# Patient Record
Sex: Female | Born: 1975 | Race: White | Hispanic: No | Marital: Married | Smoking: Never smoker
Health system: Southern US, Community
[De-identification: ages and names within clinical notes are randomized; demographics above are authoritative.]

## PROBLEM LIST (undated history)

## (undated) DIAGNOSIS — K219 Gastro-esophageal reflux disease without esophagitis: Secondary | ICD-10-CM

## (undated) DIAGNOSIS — D649 Anemia, unspecified: Secondary | ICD-10-CM

## (undated) DIAGNOSIS — M419 Scoliosis, unspecified: Secondary | ICD-10-CM

## (undated) DIAGNOSIS — I341 Nonrheumatic mitral (valve) prolapse: Secondary | ICD-10-CM

## (undated) DIAGNOSIS — R51 Headache: Secondary | ICD-10-CM

## (undated) DIAGNOSIS — G35 Multiple sclerosis: Secondary | ICD-10-CM

## (undated) DIAGNOSIS — H209 Unspecified iridocyclitis: Secondary | ICD-10-CM

## (undated) DIAGNOSIS — F32A Depression, unspecified: Secondary | ICD-10-CM

## (undated) DIAGNOSIS — F329 Major depressive disorder, single episode, unspecified: Secondary | ICD-10-CM

## (undated) DIAGNOSIS — D62 Acute posthemorrhagic anemia: Secondary | ICD-10-CM

## (undated) HISTORY — PX: WISDOM TOOTH EXTRACTION: SHX21

## (undated) HISTORY — PX: TONSILLECTOMY: SUR1361

---

## 2004-08-17 ENCOUNTER — Ambulatory Visit: Payer: Self-pay | Admitting: Psychiatry

## 2005-05-12 ENCOUNTER — Ambulatory Visit: Payer: Self-pay

## 2005-10-06 ENCOUNTER — Ambulatory Visit: Payer: Self-pay

## 2007-12-13 ENCOUNTER — Ambulatory Visit (HOSPITAL_COMMUNITY): Admission: RE | Admit: 2007-12-13 | Discharge: 2007-12-13 | Payer: Self-pay | Admitting: *Deleted

## 2008-01-04 ENCOUNTER — Ambulatory Visit (HOSPITAL_COMMUNITY): Admission: RE | Admit: 2008-01-04 | Discharge: 2008-01-04 | Payer: Self-pay | Admitting: *Deleted

## 2008-01-10 ENCOUNTER — Ambulatory Visit: Payer: Self-pay | Admitting: Internal Medicine

## 2008-01-22 ENCOUNTER — Ambulatory Visit: Payer: Self-pay

## 2008-01-22 ENCOUNTER — Encounter: Payer: Self-pay | Admitting: Internal Medicine

## 2008-02-01 ENCOUNTER — Ambulatory Visit (HOSPITAL_COMMUNITY): Admission: RE | Admit: 2008-02-01 | Discharge: 2008-02-01 | Payer: Self-pay | Admitting: *Deleted

## 2008-04-03 ENCOUNTER — Encounter: Admission: RE | Admit: 2008-04-03 | Discharge: 2008-04-03 | Payer: Self-pay | Admitting: Obstetrics and Gynecology

## 2008-05-09 ENCOUNTER — Encounter: Admission: RE | Admit: 2008-05-09 | Discharge: 2008-05-09 | Payer: Self-pay | Admitting: Obstetrics and Gynecology

## 2008-05-30 ENCOUNTER — Inpatient Hospital Stay (HOSPITAL_COMMUNITY): Admission: AD | Admit: 2008-05-30 | Discharge: 2008-06-03 | Payer: Self-pay | Admitting: Obstetrics and Gynecology

## 2008-05-31 ENCOUNTER — Encounter (INDEPENDENT_AMBULATORY_CARE_PROVIDER_SITE_OTHER): Payer: Self-pay | Admitting: Obstetrics and Gynecology

## 2008-06-04 ENCOUNTER — Encounter: Admission: RE | Admit: 2008-06-04 | Discharge: 2008-07-03 | Payer: Self-pay | Admitting: Obstetrics and Gynecology

## 2008-07-04 ENCOUNTER — Encounter: Admission: RE | Admit: 2008-07-04 | Discharge: 2008-08-03 | Payer: Self-pay | Admitting: Obstetrics and Gynecology

## 2009-07-17 IMAGING — US US OB DETAIL+14 WK
1 series · 14 of 28 positions shown · non-contrast
Comparison: none

OBSTETRICAL ULTRASOUND:
 This ultrasound was performed in The [HOSPITAL], and the [facility] report will be stored to [REDACTED] PACS.

[Series 1: us ob detail+14 wk · 14 of 76 slices shown]
[im 3/76]
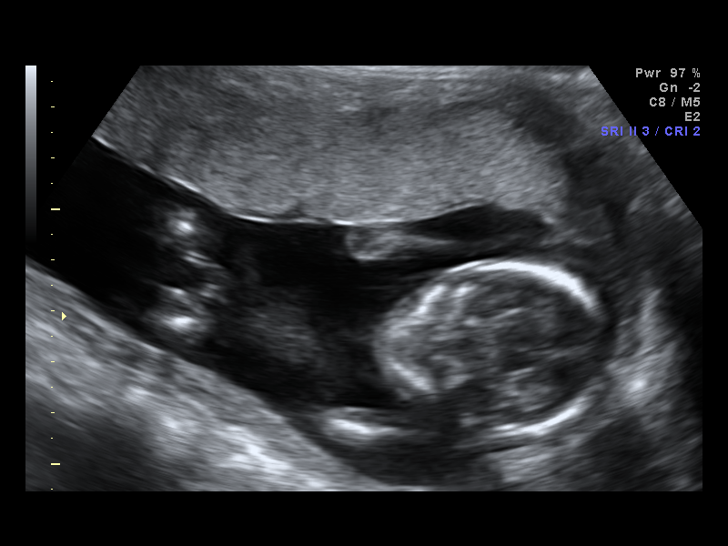
[im 9/76]
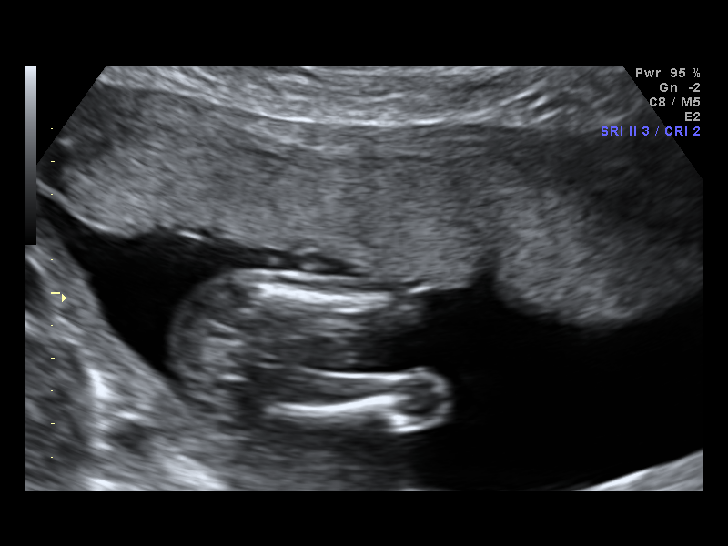
[im 14/76]
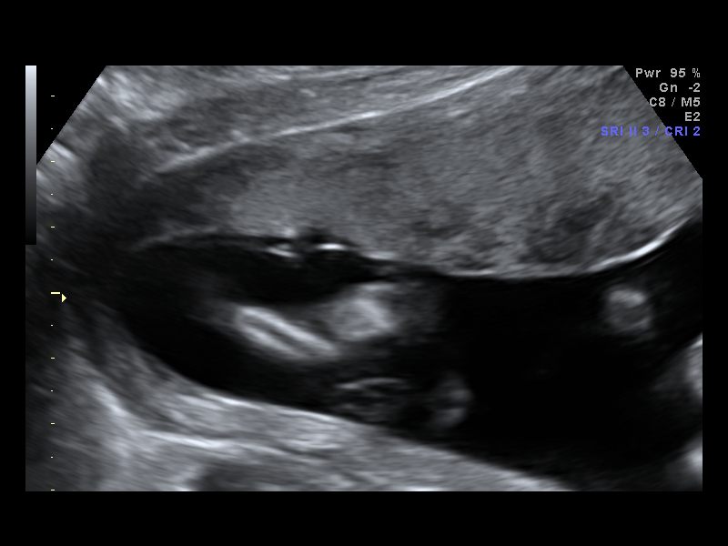
[im 20/76]
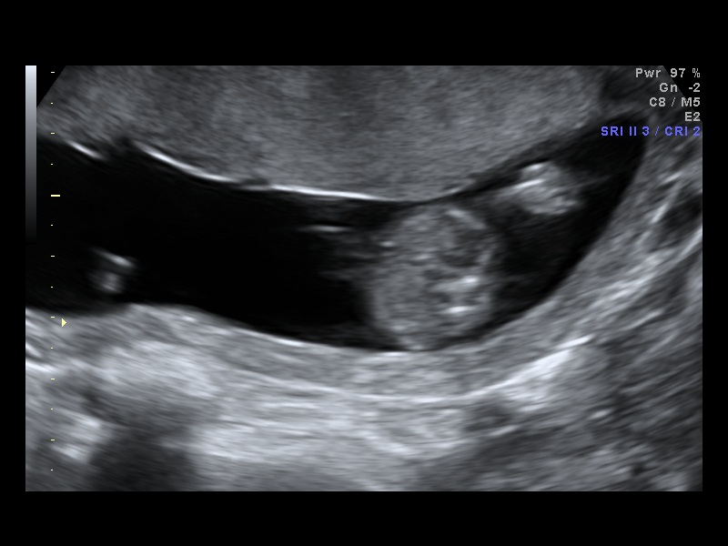
[im 26/76]
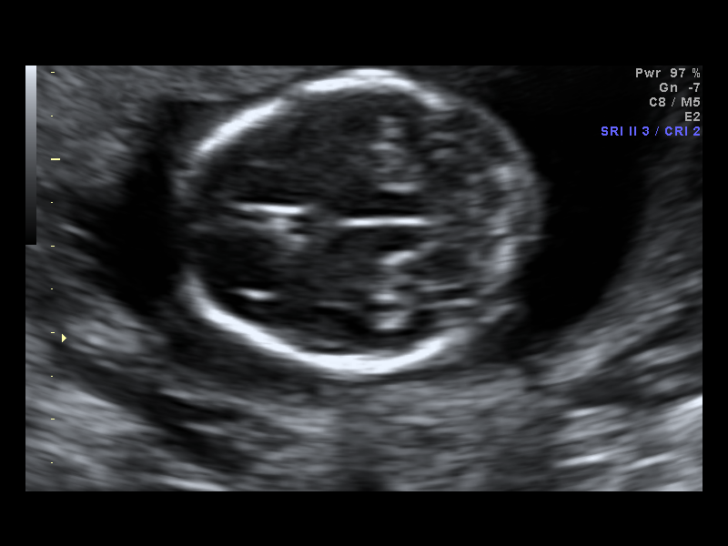
[im 31/76]
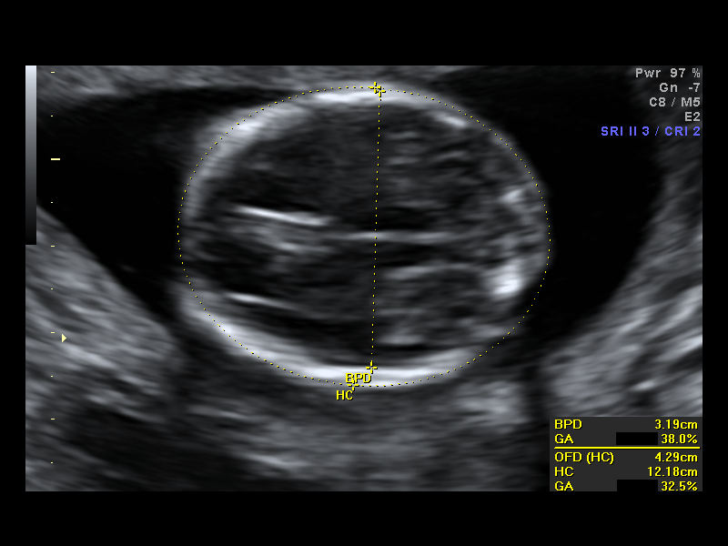
[im 37/76]
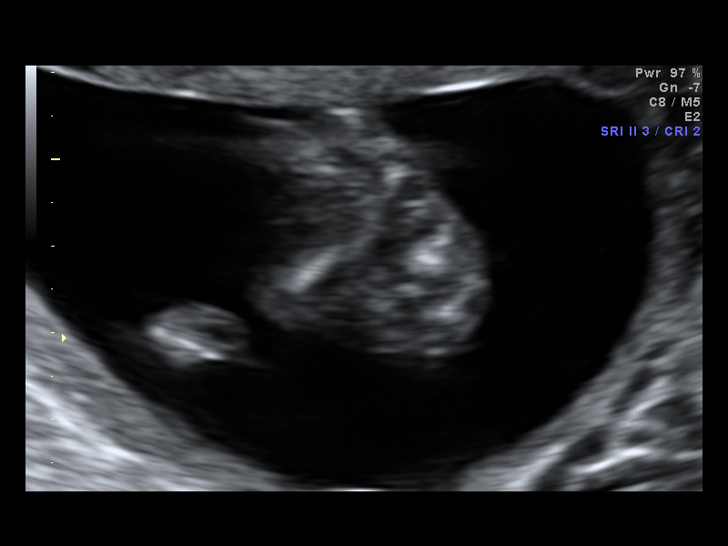
[im 42/76]
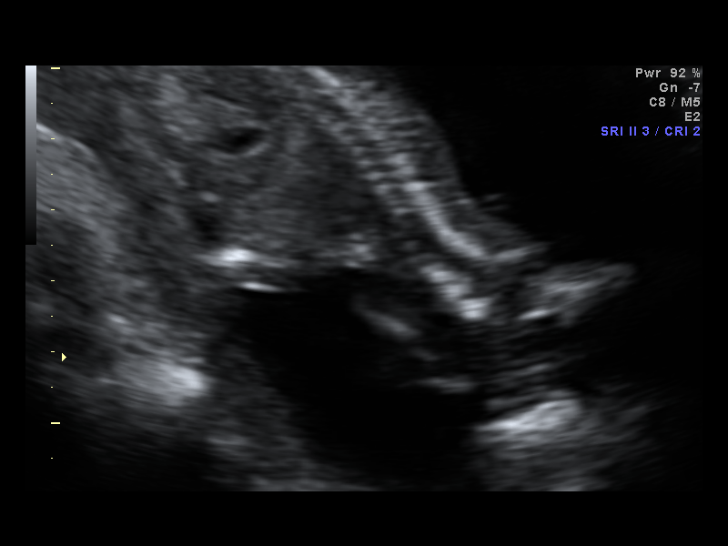
[im 48/76]
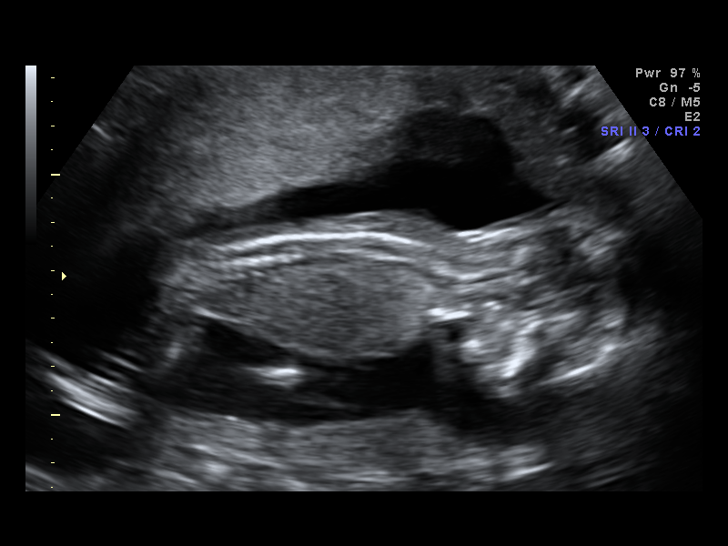
[im 53/76]
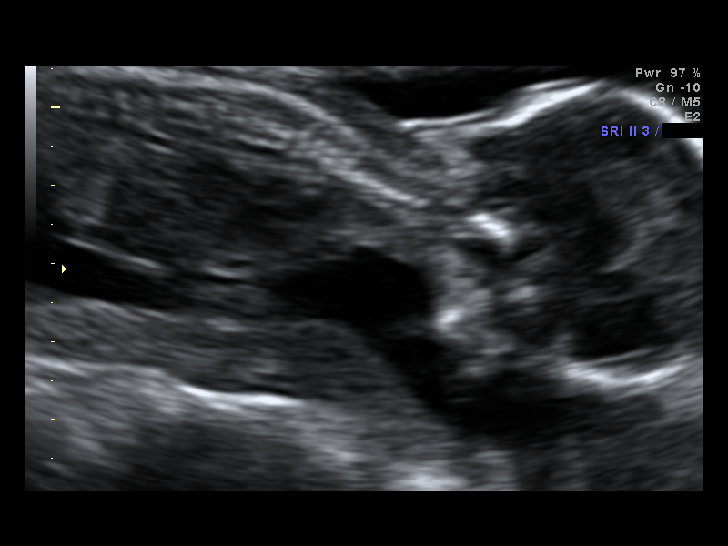
[im 59/76]
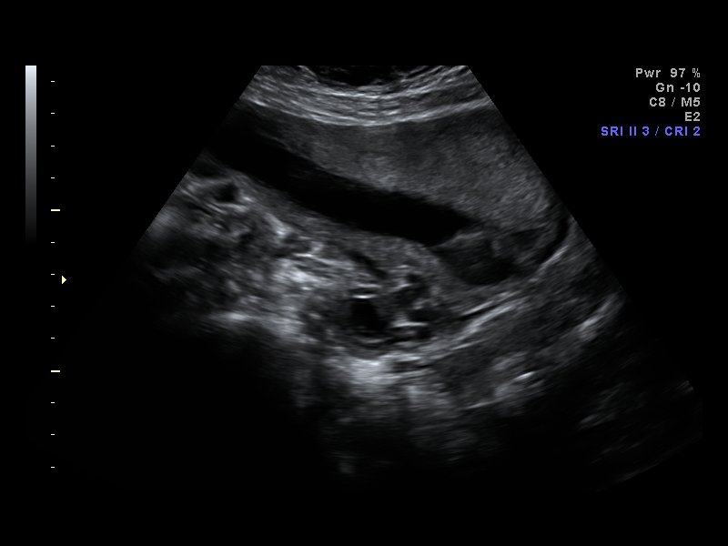
[im 64/76]
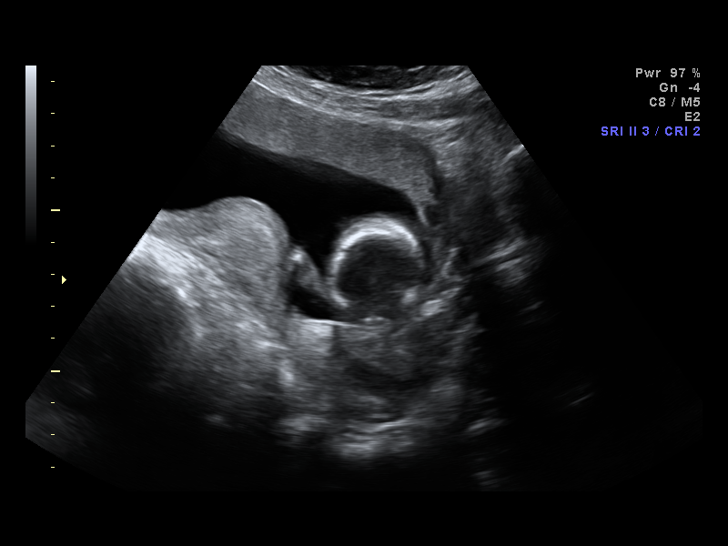
[im 70/76]
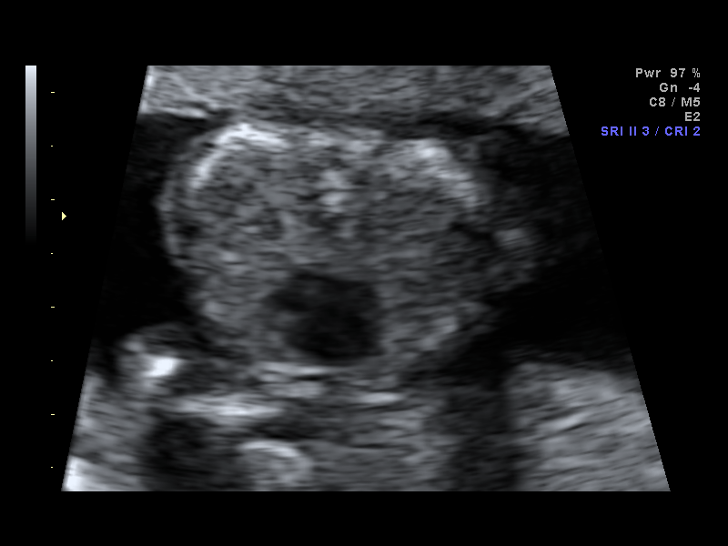
[im 76/76]
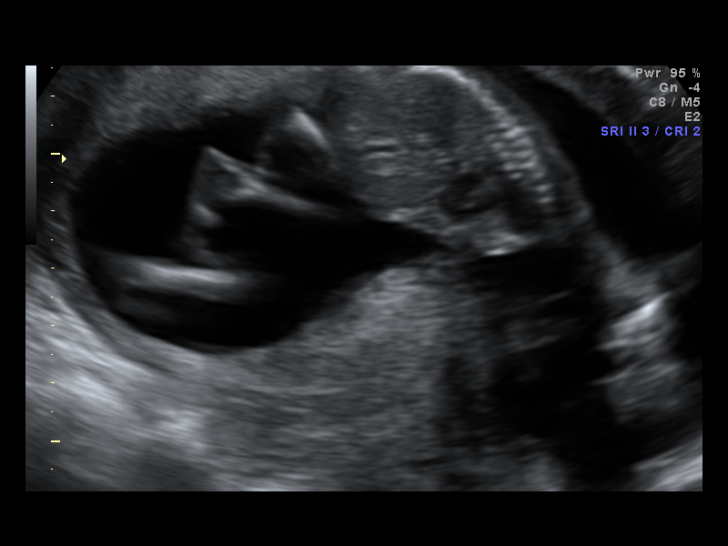

[14 of 28 positions shown; findings below may reference images not displayed]

IMPRESSION: [facility] has also been faxed to the ordering physician.

## 2010-08-15 ENCOUNTER — Encounter: Payer: Self-pay | Admitting: *Deleted

## 2010-10-21 ENCOUNTER — Inpatient Hospital Stay (HOSPITAL_COMMUNITY)
Admission: AD | Admit: 2010-10-21 | Discharge: 2010-10-21 | Disposition: A | Discharge status: Home / Self Care | Payer: BC Managed Care – PPO | Source: Ambulatory Visit | Attending: Obstetrics & Gynecology | Admitting: Obstetrics & Gynecology

## 2010-10-21 DIAGNOSIS — R059 Cough, unspecified: Secondary | ICD-10-CM | POA: Insufficient documentation

## 2010-10-21 DIAGNOSIS — R05 Cough: Secondary | ICD-10-CM | POA: Insufficient documentation

## 2010-10-21 DIAGNOSIS — O99891 Other specified diseases and conditions complicating pregnancy: Secondary | ICD-10-CM | POA: Insufficient documentation

## 2010-10-21 DIAGNOSIS — R509 Fever, unspecified: Secondary | ICD-10-CM | POA: Insufficient documentation

## 2010-10-21 LAB — URINALYSIS, ROUTINE W REFLEX MICROSCOPIC
Bilirubin Urine: NEGATIVE
Ketones, ur: NEGATIVE mg/dL
Nitrite: NEGATIVE
Urobilinogen, UA: 0.2 mg/dL (ref 0.0–1.0)

## 2010-11-04 LAB — ABO/RH: RH Type: POSITIVE

## 2010-11-04 LAB — HIV ANTIBODY (ROUTINE TESTING W REFLEX): HIV: NONREACTIVE

## 2010-12-07 NOTE — Assessment & Plan Note (Signed)
[facility] OFFICE NOTE   Lori [facility], Lori Dean                       MRN:          [id number]  DATE:01/10/2008                            DOB:          13-Apr-1976    REFERRING PHYSICIAN:  Lendon Colonel, MD   PRIMARY DOCTOR:  [facility].   REFERRING PHYSICIAN:  Tresa Endo A. Ernestina Penna, MD, [facility].   REASON FOR CONSULTATION:  Shortness of breath during pregnancy.   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  She has a history of mitral valve prolapse with  associated mitral regurgitation as well as relapsing/remitting multiple  sclerosis and migraine headaches.   Apparently, she was diagnosed with mitral valve prolapse and mitral  regurgitation after a murmur was noted on her exam and this was back in  2001.  She had an echocardiogram which confirmed the mitral valve  pathology.  This has really never been an issue for her.  She has always  been quite functional without any difficulties.  She did have a flare  from multiple sclerosis about a year ago and was treated with a pulse  dose steroids.   She is currently [redacted] weeks pregnant.  She notes that about 6 weeks ago  she began to experience shortness of breath, and she notes that this is  notable when she goes up steps and some people have even noticed it when  she is talking that she is a little bit more out of breath.  She has not  had any chest pain.  No lower extremity edema, no orthopnea, and no PND.  Her energy level is actually improving.  She was somewhat run down to  the first trimester and in the second trimester she has really been  feeling pretty well.  She denies any fevers, chills, or cough.  She has  no prior history of lung disease.  There has been no syncope.   REVIEW OF SYSTEMS:  As per HPI and problem list, otherwise all systems  negative.   PROBLEMS:  1. History of  mitral valve prolapse with mitral regurgitation.      Apparently, this was mild.  2. Migraine headaches.  3. Relapsing/remitting multiple sclerosis, manifested primarily as      optical neuritis.   CURRENT MEDICATIONS:  Prenatal vitamins.   ALLERGIES:  [facility].   SOCIAL HISTORY:  She is married.  This is her first pregnancy.  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  No tobacco and  no alcohol.   FAMILY HISTORY:  She is adopted.  On her father's side, no family  history of premature coronary artery disease.  She knows there is some  family history of congenital heart disease, but she has never been  diagnosed with this.   PHYSICAL EXAMINATION:  GENERAL:  She is well appearing, very pleasant.  She ambulates around the clinic without any respiratory difficulty.  VITAL SIGNS:  Blood pressure is 100/64, heart rate 87, and weight is  160.  HEENT:  Normal.  NECK:  Supple.  There is no JVD.  Carotids are 2+ bilaterally without  any bruits.  There is no lymphadenopathy or thyromegaly.  CARDIAC:  PMI is nondisplaced.  She is regular with a 2/6 tricuspid  regurgitation murmur at the left sternal border and 2/6 mitral  regurgitation at the apex.  There does seem to be a soft click.  LUNGS:  Clear.  ABDOMEN:  Gravid but nontender.  Good bowel sounds.  EXTREMITIES:  Warm with no cyanosis, clubbing, or edema.  Good distal  pulses.  No rash.  There are no cords and no erythema.  NEUROLOGIC:  Alert and oriented x3.  Cranial nerves II through XII are  intact.  She moves all 4 extremities without difficulty.  Affect is very  pleasant.   EKG shows normal sinus rhythm at a rate of 87, no ST-T wave  abnormalities.  There is no preexcitation.  QT interval is normal.   ASSESSMENT AND PLAN:  Dyspnea.  I suspect this is related to the  cardiovascular demands of pregnancy. However, she does have some  valvular pathology.  I think it is reasonable to go ahead and repeat her   echocardiogram to measure her pulmonary pressures and also look at her  valves.  At this point, I would not do any further workup; however, if  it does continue to get much worse, I have asked her to get back in  touch with me and we can reevaluate.     Lori Buckles. Bensimhon, MD  Electronically Signed    DRB/MedQ  DD: 01/10/2008  DT: 01/11/2008  Job #: [id number]

## 2010-12-07 NOTE — Op Note (Signed)
NAMELARRISA, Lori Dean              ACCOUNT NO.:  [id number]   MEDICAL RECORD NO.:  [id number]          PATIENT TYPE:  INP   LOCATION:  9169                          FACILITY:  [facility]   PHYSICIAN:  Lenoard Aden, M.D.DATE OF BIRTH:  09-27-1975   DATE OF PROCEDURE:  05/31/2008  DATE OF DISCHARGE:                               OPERATIVE REPORT   PREOPERATIVE DIAGNOSES:  A 41-week intrauterine pregnancy, failure to  descend.   POSTOPERATIVE DIAGNOSES:  A 41-week intrauterine pregnancy, failure to  descend, occiput posterior position.   PROCEDURE:  Primary low segment transverse cesarean section.   SURGEON:  Lenoard Aden, MD   ASSISTANT:  Marlinda Mike, CNM   ANESTHESIA:  Epidural by Dr. Jean Rosenthal.   BLOOD LOSS:  1000 mL.   COMPLICATIONS:  None.   DRAINS:  Foley.   COUNTS:  Correct.   Patient to recovery in good condition.  Placenta to pathology.   FINDINGS:  Full-term living female, occiput posterior position, Apgars 8  and 9.  Placenta manually intact, anterior in location.  Normal tubes,  normal ovaries.  Two-layer uterine closure.   BRIEF OPERATING NOTE:  Being apprised of risks of anesthesia, infection,  bleeding, injury to abdominal organs, need for repair, delayed versus  immediate complications to include bowel and bladder injury, the patient  was brought to the operating room where she was administered dosing of  epidural anesthetic without complications, prepped and draped in the  usual sterile fashion.  Foley catheter was placed after achieving  adequate anesthesia and dilute Marcaine solution placed.  A Pfannenstiel  skin incision made with the scalpel, carried down to the fascia, and was  nicked in the midline and opened transversely using Mayo scissors.  Rectus muscles were dissected sharply in the midline.  The peritoneum  entered sharply.  Bladder blade was placed.  Visceral peritoneum scored  sharply off the lower uterine segment.  Kerr hysterotomy  incision made  atraumatic delivery of a full-term living female, handed to  pediatricians team, Apgars 8 and 9.  Cord blood collected.  Placenta  delivered manually, intact 3-vessel cord.  Uterus exteriorized, curetted  using a dry lap pack and closed in 2 running imbricating layers of 0  Monocryl suture.  Good hemostasis was noted.  Bladder flap was inspected  and found be hemostatic.  Irrigation was accomplished.  Uterus replaced  into the abdominal cavity and reinspection reveals good hemostasis and  bladder flap was  hemostatic.  The peritoneum was inspected and found to be hemostatic.  Fascia closed using 0 Monocryl in running fashion.  Skin reapproximated  using staples.  The patient tolerated the procedure well and was  transferred to recovery in good condition.      Lenoard Aden, M.D.  Electronically Signed     RJT/MEDQ  D:  05/31/2008  T:  06/01/2008  Job:  [id number]

## 2010-12-07 NOTE — H&P (Signed)
NAMEMICHELENE, Lori [facility]              ACCOUNT NO.:  [id number]   MEDICAL RECORD NO.:  [id number]          PATIENT TYPE:  INP   LOCATION:  9169                          FACILITY:  [facility]   PHYSICIAN:  Lenoard Aden, M.D.DATE OF BIRTH:  03/30/1976   DATE OF ADMISSION:  05/30/2008  DATE OF DISCHARGE:                              HISTORY & PHYSICAL   CHIEF COMPLAINT:  Postdate for induction.   She is a 35 year old white female, G1, P0 at 70 weeks' gestation, who  presents for cervical ripening and induction.  She is a nonsmoker and  nondrinker.  Denies domestic or physical violence.   She has no known drug allergies.   FAMILY HISTORY:  Depression, heart disease, prostate cancer, and  migraine headaches.   Medications include prenatal vitamins and iron.   Her first pregnancy otherwise uncomplicated.  Most recent ultrasound  revealed a normal amniotic fluid index and estimated fetal weight is 8.5  pounds.   PHYSICAL EXAMINATION:  GENERAL:  She is a well-developed and well-  nourished white female in no acute distress.  HEENT:  Normal.  NECK:  Supple.  Full range of motion.  LUNGS:  Clear.  ABDOMEN:  Benign, soft, gravid, and nontender.  PELVIC:  Cervix is fingertip, 50%, vertex -1.  EXTREMITIES:  There are no cords.  NEUROLOGIC:  Nonfocal.  SKIN:  Intact.   IMPRESSION:  A 41-week intrauterine pregnancy for cervical ripening and  induction.  Cervidil was placed.  NST reactive.   PLAN:  Continue with induction.  Anticipate attempts of vaginal  delivery.      Lenoard Aden, M.D.  Electronically Signed     RJT/MEDQ  D:  05/30/2008  T:  05/31/2008  Job:  [id number]

## 2010-12-07 NOTE — Discharge Summary (Signed)
[facility], Lori Dean              ACCOUNT NO.:  [id number]   MEDICAL RECORD NO.:  [id number]          PATIENT TYPE:  INP   LOCATION:  9110                          FACILITY:  [facility]   PHYSICIAN:  Lenoard Aden, M.D.DATE OF BIRTH:  May 06, 1976   DATE OF ADMISSION:  05/30/2008  DATE OF DISCHARGE:  06/03/2008                               DISCHARGE SUMMARY   The patient underwent uncomplicated primary C-section on May 31, 2008.  Postoperative course uncomplicated.  Tolerating diet well.  Discharge teaching done.   DISCHARGE MEDICATIONS:  Tylox, prenatal vitamins, and iron.   FOLLOWUP:  Follow up in the office in 4-6 weeks.   DISCHARGE CONDITION:  Stable.      Lenoard Aden, M.D.  Electronically Signed     RJT/MEDQ  D:  07/20/2008  T:  07/21/2008  Job:  [id number]

## 2011-04-26 LAB — CBC
Hemoglobin: 9.9 — ABNORMAL LOW
MCHC: 34.1
MCHC: 34.2
MCV: 90.6
MCV: 91
Platelets: 153
Platelets: 199
WBC: 13.4 — ABNORMAL HIGH
WBC: 26 — ABNORMAL HIGH

## 2011-06-06 ENCOUNTER — Other Ambulatory Visit: Payer: Self-pay | Admitting: Obstetrics and Gynecology

## 2011-06-08 ENCOUNTER — Encounter (HOSPITAL_COMMUNITY): Payer: Self-pay

## 2011-06-09 ENCOUNTER — Encounter (HOSPITAL_COMMUNITY)
Admission: RE | Admit: 2011-06-09 | Discharge: 2011-06-09 | Disposition: A | Discharge status: Home / Self Care | Payer: BC Managed Care – PPO | Source: Ambulatory Visit | Attending: Obstetrics and Gynecology | Admitting: Obstetrics and Gynecology

## 2011-06-09 ENCOUNTER — Encounter (HOSPITAL_COMMUNITY): Payer: Self-pay

## 2011-06-09 ENCOUNTER — Encounter (HOSPITAL_COMMUNITY): Payer: Self-pay | Admitting: Pharmacy Technician

## 2011-06-09 HISTORY — DX: Headache: R51

## 2011-06-09 HISTORY — DX: Major depressive disorder, single episode, unspecified: F32.9

## 2011-06-09 HISTORY — DX: Depression, unspecified: F32.A

## 2011-06-09 HISTORY — DX: Scoliosis, unspecified: M41.9

## 2011-06-09 HISTORY — DX: Nonrheumatic mitral (valve) prolapse: I34.1

## 2011-06-09 HISTORY — DX: Anemia, unspecified: D64.9

## 2011-06-09 HISTORY — DX: Multiple sclerosis: G35

## 2011-06-09 HISTORY — DX: Gastro-esophageal reflux disease without esophagitis: K21.9

## 2011-06-09 LAB — CBC
Hemoglobin: 11.3 g/dL — ABNORMAL LOW (ref 12.0–15.0)
MCV: 89.8 fL (ref 78.0–100.0)
Platelets: 188 10*3/uL (ref 150–400)
RBC: 3.73 MIL/uL — ABNORMAL LOW (ref 3.87–5.11)
WBC: 12.8 10*3/uL — ABNORMAL HIGH (ref 4.0–10.5)

## 2011-06-09 LAB — RPR: RPR Ser Ql: NONREACTIVE

## 2011-06-09 NOTE — Patient Instructions (Addendum)
   Your procedure is scheduled WU:[id number], November 23rd  Enter through the Main Entrance of [facility] at:6am Pick up the phone at the desk and dial [phone] and inform us of your arrival.  Please call this number if you have any problems the morning of surgery: [phone]  Remember: Do not eat food after midnight:Thursday Do not drink clear liquids after:midnight Thursday Take these medicines the morning of surgery with a SIP OF WATER:zantac   Do not wear jewelry, make-up, or FINGER nail polish Do not wear lotions, powders, or perfumes.  You may not  wear deodorant. Do not shave 48 hours prior to surgery. Do not bring valuables to the hospital.  Leave suitcase in the car. After Surgery it may be brought to your room. For patients being admitted to the hospital, checkout time is 11:00am the day of discharge.   Remember to use your hibiclens as instructed.Please shower with 1/2 bottle the evening before your surgery and the other 1/2 bottle the morning of surgery.

## 2011-06-16 NOTE — H&P (Signed)
[facility], Lori Dean              ACCOUNT NO.:  [id number]  MEDICAL RECORD NO.:  [id number]  LOCATION:                                 FACILITY:  PHYSICIAN:  Lenoard Aden, M.D.DATE OF BIRTH:  1976-06-05  DATE OF ADMISSION: DATE OF DISCHARGE:                             HISTORY & PHYSICAL   CHIEF COMPLAINT:  Previous C-section repeat.  HISTORY OF PRESENT ILLNESS:  She is a 35 year old white female, G2, P1 at 39-5/7th week gestation who presents for elective repeat C-section. She is a nonsmoker, nondrinker.  She denies domestic or physical violence.  ALLERGIES:  She has allergies to NEOSPORIN.  MEDICATIONS:  Zantac, __________ and prenatal vitamins.  SURGICAL HISTORY:  Remarkable for C-section x1, wisdom tooth extraction, and tonsillectomy.  Prenatal course complicated by group B strep positivity.  FAMILY HISTORY:  Prostate cancer, heart disease, migraine headaches, thyroid cancer.  PHYSICAL EXAMINATION:  GENERAL:  She is a well-developed, well- nourished, white female, in no acute distress. HEENT:  Normal. NECK:  Supple.  Full range of motion. LUNGS:  Clear. HEART:  Regular rhythm. ABDOMEN:  Soft, gravid, nontender. PELVIC:  Estimated fetal weight 7 pounds.  Cervix is 2-3 cm, 70%, vertex, -1. EXTREMITIES:  No cords. NEUROLOGIC:  Nonfocal. SKIN:  Intact.  IMPRESSION: 1. A 39 and 5/7th week intrauterine pregnancy for repeat C-section. 2. Group B strep positive.  PLAN:  Proceed with repeat C-section.  Risks of anesthesia, infection, bleeding, injury to abdominal organs, need for repair is discussed. Delayed versus immediate complications to include bowel and bladder injury noted.  The patient acknowledges and will proceed.     Lenoard Aden, M.D.     RJT/MEDQ  D:  06/16/2011  T:  06/16/2011  Job:  [id number]

## 2011-06-17 ENCOUNTER — Encounter (HOSPITAL_COMMUNITY): Payer: Self-pay | Admitting: Anesthesiology

## 2011-06-17 ENCOUNTER — Inpatient Hospital Stay (HOSPITAL_COMMUNITY)
Admission: RE | Admit: 2011-06-17 | Discharge: 2011-06-20 | DRG: 370 | Disposition: A | Discharge status: Home / Self Care | Payer: BC Managed Care – PPO | Source: Ambulatory Visit | Attending: Obstetrics and Gynecology | Admitting: Obstetrics and Gynecology

## 2011-06-17 ENCOUNTER — Encounter (HOSPITAL_COMMUNITY)
Admission: RE | Disposition: A | Discharge status: Home / Self Care | Payer: Self-pay | Source: Ambulatory Visit | Attending: Obstetrics and Gynecology

## 2011-06-17 ENCOUNTER — Inpatient Hospital Stay (HOSPITAL_COMMUNITY): Payer: BC Managed Care – PPO | Admitting: Anesthesiology

## 2011-06-17 ENCOUNTER — Encounter (HOSPITAL_COMMUNITY): Payer: Self-pay | Admitting: *Deleted

## 2011-06-17 DIAGNOSIS — O9903 Anemia complicating the puerperium: Secondary | ICD-10-CM | POA: Diagnosis not present

## 2011-06-17 DIAGNOSIS — Z01812 Encounter for preprocedural laboratory examination: Secondary | ICD-10-CM

## 2011-06-17 DIAGNOSIS — Z01818 Encounter for other preprocedural examination: Secondary | ICD-10-CM

## 2011-06-17 DIAGNOSIS — D62 Acute posthemorrhagic anemia: Secondary | ICD-10-CM | POA: Diagnosis not present

## 2011-06-17 DIAGNOSIS — O34219 Maternal care for unspecified type scar from previous cesarean delivery: Principal | ICD-10-CM | POA: Diagnosis present

## 2011-06-17 DIAGNOSIS — Z22330 Carrier of Group B streptococcus: Secondary | ICD-10-CM

## 2011-06-17 DIAGNOSIS — O99892 Other specified diseases and conditions complicating childbirth: Secondary | ICD-10-CM | POA: Diagnosis present

## 2011-06-17 HISTORY — DX: Acute posthemorrhagic anemia: D62

## 2011-06-17 SURGERY — Surgical Case
Anesthesia: Spinal

## 2011-06-17 MED ORDER — MEPERIDINE HCL 25 MG/ML IJ SOLN: 6.2500 mg | INTRAMUSCULAR | Status: DC | PRN

## 2011-06-17 MED ORDER — DIPHENHYDRAMINE HCL 50 MG/ML IJ SOLN: 12.5000 mg | INTRAMUSCULAR | Status: DC | PRN

## 2011-06-17 MED ORDER — IBUPROFEN 600 MG PO TABS: 600.0000 mg | ORAL_TABLET | Freq: Four times a day (QID) | ORAL | Status: DC | PRN

## 2011-06-17 MED ORDER — KETOROLAC TROMETHAMINE 30 MG/ML IJ SOLN: 30.0000 mg | Freq: Four times a day (QID) | INTRAMUSCULAR | Status: AC | PRN

## 2011-06-17 MED ORDER — LACTATED RINGERS IV SOLN
INTRAVENOUS | Status: DC
  Administered 2011-06-17: 16:00:00 via INTRAVENOUS

## 2011-06-17 MED ORDER — KETOROLAC TROMETHAMINE 30 MG/ML IJ SOLN: 15.0000 mg | Freq: Once | INTRAMUSCULAR | Status: DC | PRN

## 2011-06-17 MED ORDER — EPHEDRINE 5 MG/ML INJ
INTRAVENOUS | Status: AC
  Filled 2011-06-17: qty 10

## 2011-06-17 MED ORDER — DIBUCAINE 1 % RE OINT: 1.0000 "application " | TOPICAL_OINTMENT | RECTAL | Status: DC | PRN

## 2011-06-17 MED ORDER — METHYLERGONOVINE MALEATE 0.2 MG/ML IJ SOLN: 0.2000 mg | INTRAMUSCULAR | Status: DC | PRN

## 2011-06-17 MED ORDER — SIMETHICONE 80 MG PO CHEW
80.0000 mg | CHEWABLE_TABLET | Freq: Three times a day (TID) | ORAL | Status: DC
  Administered 2011-06-18 – 2011-06-20 (×9): 80 mg via ORAL

## 2011-06-17 MED ORDER — METHYLERGONOVINE MALEATE 0.2 MG/ML IJ SOLN
INTRAMUSCULAR | Status: DC | PRN
  Administered 2011-06-17: 0.2 mg via INTRAMUSCULAR

## 2011-06-17 MED ORDER — EPHEDRINE SULFATE 50 MG/ML IJ SOLN
INTRAMUSCULAR | Status: DC | PRN
  Administered 2011-06-17: 5 mg via INTRAVENOUS
  Administered 2011-06-17: 10 mg via INTRAVENOUS
  Administered 2011-06-17 (×7): 5 mg via INTRAVENOUS

## 2011-06-17 MED ORDER — DIPHENHYDRAMINE HCL 25 MG PO CAPS: 25.0000 mg | ORAL_CAPSULE | Freq: Four times a day (QID) | ORAL | Status: DC | PRN

## 2011-06-17 MED ORDER — IBUPROFEN 600 MG PO TABS
600.0000 mg | ORAL_TABLET | Freq: Four times a day (QID) | ORAL | Status: DC
  Administered 2011-06-17 – 2011-06-20 (×11): 600 mg via ORAL
  Filled 2011-06-17 (×11): qty 1

## 2011-06-17 MED ORDER — SODIUM CHLORIDE 0.9 % IV SOLN: 1.0000 ug/kg/h | INTRAVENOUS | Status: DC | PRN

## 2011-06-17 MED ORDER — ONDANSETRON HCL 4 MG PO TABS: 4.0000 mg | ORAL_TABLET | ORAL | Status: DC | PRN

## 2011-06-17 MED ORDER — ONDANSETRON HCL 4 MG/2ML IJ SOLN
INTRAMUSCULAR | Status: DC | PRN
  Administered 2011-06-17: 4 mg via INTRAVENOUS

## 2011-06-17 MED ORDER — HYDROMORPHONE HCL PF 1 MG/ML IJ SOLN
0.2500 mg | INTRAMUSCULAR | Status: DC | PRN
  Administered 2011-06-17: 0.5 mg via INTRAVENOUS

## 2011-06-17 MED ORDER — ONDANSETRON HCL 4 MG/2ML IJ SOLN: 4.0000 mg | Freq: Three times a day (TID) | INTRAMUSCULAR | Status: DC | PRN

## 2011-06-17 MED ORDER — MORPHINE SULFATE (PF) 0.5 MG/ML IJ SOLN
INTRAMUSCULAR | Status: DC | PRN
  Administered 2011-06-17: .2 mg via INTRATHECAL

## 2011-06-17 MED ORDER — CEFAZOLIN SODIUM 1-5 GM-% IV SOLN
1.0000 g | INTRAVENOUS | Status: AC
  Administered 2011-06-17: 1 g via INTRAVENOUS

## 2011-06-17 MED ORDER — SCOPOLAMINE 1 MG/3DAYS TD PT72
1.0000 | MEDICATED_PATCH | Freq: Once | TRANSDERMAL | Status: DC
  Administered 2011-06-17: 1.5 mg via TRANSDERMAL

## 2011-06-17 MED ORDER — SIMETHICONE 80 MG PO CHEW
80.0000 mg | CHEWABLE_TABLET | ORAL | Status: DC | PRN
  Administered 2011-06-18: 80 mg via ORAL

## 2011-06-17 MED ORDER — BUPIVACAINE HCL (PF) 0.25 % IJ SOLN
INTRAMUSCULAR | Status: DC | PRN
  Administered 2011-06-17: 10 mL

## 2011-06-17 MED ORDER — MENTHOL 3 MG MT LOZG: 1.0000 | LOZENGE | OROMUCOSAL | Status: DC | PRN

## 2011-06-17 MED ORDER — TETANUS-DIPHTH-ACELL PERTUSSIS 5-2.5-18.5 LF-MCG/0.5 IM SUSP: 0.5000 mL | Freq: Once | INTRAMUSCULAR | Status: DC

## 2011-06-17 MED ORDER — METHYLERGONOVINE MALEATE 0.2 MG PO TABS: 0.2000 mg | ORAL_TABLET | ORAL | Status: DC | PRN

## 2011-06-17 MED ORDER — PROMETHAZINE HCL 25 MG/ML IJ SOLN: 6.2500 mg | INTRAMUSCULAR | Status: DC | PRN

## 2011-06-17 MED ORDER — MORPHINE SULFATE 0.5 MG/ML IJ SOLN
INTRAMUSCULAR | Status: AC
  Filled 2011-06-17: qty 10

## 2011-06-17 MED ORDER — SENNOSIDES-DOCUSATE SODIUM 8.6-50 MG PO TABS
2.0000 | ORAL_TABLET | Freq: Every day | ORAL | Status: DC
  Administered 2011-06-18 – 2011-06-19 (×3): 2 via ORAL

## 2011-06-17 MED ORDER — NALOXONE HCL 0.4 MG/ML IJ SOLN: 0.4000 mg | INTRAMUSCULAR | Status: DC | PRN

## 2011-06-17 MED ORDER — OXYTOCIN 20 UNITS IN LACTATED RINGERS INFUSION - SIMPLE
125.0000 mL/h | INTRAVENOUS | Status: AC
  Administered 2011-06-17: 125 mL/h via INTRAVENOUS

## 2011-06-17 MED ORDER — FENTANYL CITRATE 0.05 MG/ML IJ SOLN
INTRAMUSCULAR | Status: AC
  Filled 2011-06-17: qty 2

## 2011-06-17 MED ORDER — LACTATED RINGERS IV SOLN
INTRAVENOUS | Status: DC
  Administered 2011-06-17 (×4): via INTRAVENOUS

## 2011-06-17 MED ORDER — HYDROMORPHONE HCL PF 1 MG/ML IJ SOLN
INTRAMUSCULAR | Status: AC
  Filled 2011-06-17: qty 1

## 2011-06-17 MED ORDER — NALBUPHINE HCL 10 MG/ML IJ SOLN: 5.0000 mg | INTRAMUSCULAR | Status: DC | PRN

## 2011-06-17 MED ORDER — OXYCODONE-ACETAMINOPHEN 5-325 MG PO TABS
1.0000 | ORAL_TABLET | ORAL | Status: DC | PRN
  Administered 2011-06-17 – 2011-06-20 (×10): 1 via ORAL
  Filled 2011-06-17 (×10): qty 1

## 2011-06-17 MED ORDER — LANOLIN HYDROUS EX OINT: 1.0000 "application " | TOPICAL_OINTMENT | CUTANEOUS | Status: DC | PRN

## 2011-06-17 MED ORDER — OXYTOCIN 20 UNITS IN LACTATED RINGERS INFUSION - SIMPLE
INTRAVENOUS | Status: AC
  Filled 2011-06-17: qty 1000

## 2011-06-17 MED ORDER — KETOROLAC TROMETHAMINE 30 MG/ML IJ SOLN
30.0000 mg | Freq: Four times a day (QID) | INTRAMUSCULAR | Status: AC | PRN
  Filled 2011-06-17: qty 2

## 2011-06-17 MED ORDER — METHYLERGONOVINE MALEATE 0.2 MG/ML IJ SOLN
INTRAMUSCULAR | Status: AC
  Filled 2011-06-17: qty 1

## 2011-06-17 MED ORDER — PHENYLEPHRINE HCL 10 MG/ML IJ SOLN
INTRAMUSCULAR | Status: DC | PRN
  Administered 2011-06-17 (×5): 40 ug via INTRAVENOUS

## 2011-06-17 MED ORDER — OXYTOCIN 10 UNIT/ML IJ SOLN
INTRAMUSCULAR | Status: AC
  Filled 2011-06-17: qty 4

## 2011-06-17 MED ORDER — ONDANSETRON HCL 4 MG/2ML IJ SOLN
INTRAMUSCULAR | Status: AC
  Filled 2011-06-17: qty 2

## 2011-06-17 MED ORDER — ZOLPIDEM TARTRATE 5 MG PO TABS: 5.0000 mg | ORAL_TABLET | Freq: Every evening | ORAL | Status: DC | PRN

## 2011-06-17 MED ORDER — PRENATAL PLUS 27-1 MG PO TABS
1.0000 | ORAL_TABLET | Freq: Every day | ORAL | Status: DC
  Administered 2011-06-18 – 2011-06-20 (×3): 1 via ORAL
  Filled 2011-06-17 (×3): qty 1

## 2011-06-17 MED ORDER — FENTANYL CITRATE 0.05 MG/ML IJ SOLN
INTRAMUSCULAR | Status: DC | PRN
  Administered 2011-06-17: 12.5 ug via INTRATHECAL

## 2011-06-17 MED ORDER — ONDANSETRON HCL 4 MG/2ML IJ SOLN: 4.0000 mg | INTRAMUSCULAR | Status: DC | PRN

## 2011-06-17 MED ORDER — DIPHENHYDRAMINE HCL 25 MG PO CAPS: 25.0000 mg | ORAL_CAPSULE | ORAL | Status: DC | PRN

## 2011-06-17 MED ORDER — DIPHENHYDRAMINE HCL 50 MG/ML IJ SOLN: 25.0000 mg | INTRAMUSCULAR | Status: DC | PRN

## 2011-06-17 MED ORDER — SODIUM CHLORIDE 0.9 % IJ SOLN: 3.0000 mL | INTRAMUSCULAR | Status: DC | PRN

## 2011-06-17 MED ORDER — BUPIVACAINE IN DEXTROSE 0.75-8.25 % IT SOLN
INTRATHECAL | Status: DC | PRN
  Administered 2011-06-17: 11.25 mg via INTRATHECAL

## 2011-06-17 MED ORDER — OXYTOCIN 20 UNITS IN LACTATED RINGERS INFUSION - SIMPLE
INTRAVENOUS | Status: DC | PRN
  Administered 2011-06-17: 40 [IU] via INTRAVENOUS

## 2011-06-17 MED ORDER — PHENYLEPHRINE 40 MCG/ML (10ML) SYRINGE FOR IV PUSH (FOR BLOOD PRESSURE SUPPORT)
PREFILLED_SYRINGE | INTRAVENOUS | Status: AC
  Filled 2011-06-17: qty 5

## 2011-06-17 MED ORDER — KETOROLAC TROMETHAMINE 60 MG/2ML IM SOLN
60.0000 mg | Freq: Once | INTRAMUSCULAR | Status: AC | PRN
  Administered 2011-06-17: 60 mg via INTRAMUSCULAR
  Filled 2011-06-17: qty 2

## 2011-06-17 SURGICAL SUPPLY — 31 items
CLOTH BEACON ORANGE TIMEOUT ST (SAFETY) ×2 IMPLANT
CONTAINER PREFILL 10% NBF 15ML (MISCELLANEOUS) IMPLANT
DRESSING TELFA 8X3 (GAUZE/BANDAGES/DRESSINGS) IMPLANT
DRSG COVADERM 4X10 (GAUZE/BANDAGES/DRESSINGS) ×1 IMPLANT
ELECT REM PT RETURN 9FT ADLT (ELECTROSURGICAL) ×2
ELECTRODE REM PT RTRN 9FT ADLT (ELECTROSURGICAL) ×1 IMPLANT
EXTRACTOR VACUUM M CUP 4 TUBE (SUCTIONS) IMPLANT
GAUZE SPONGE 4X4 12PLY STRL LF (GAUZE/BANDAGES/DRESSINGS) ×4 IMPLANT
GLOVE BIO SURGEON STRL SZ7.5 (GLOVE) ×4 IMPLANT
GOWN PREVENTION PLUS LG XLONG (DISPOSABLE) ×4 IMPLANT
GOWN PREVENTION PLUS XLARGE (GOWN DISPOSABLE) ×2 IMPLANT
KIT ABG SYR 3ML LUER SLIP (SYRINGE) IMPLANT
NDL HYPO 25X1 1.5 SAFETY (NEEDLE) ×1 IMPLANT
NDL HYPO 25X5/8 SAFETYGLIDE (NEEDLE) IMPLANT
NEEDLE HYPO 25X1 1.5 SAFETY (NEEDLE) ×2 IMPLANT
NEEDLE HYPO 25X5/8 SAFETYGLIDE (NEEDLE) IMPLANT
NS IRRIG 1000ML POUR BTL (IV SOLUTION) ×2 IMPLANT
PACK C SECTION WH (CUSTOM PROCEDURE TRAY) ×2 IMPLANT
PAD ABD 7.5X8 STRL (GAUZE/BANDAGES/DRESSINGS) IMPLANT
SLEEVE SCD COMPRESS KNEE MED (MISCELLANEOUS) IMPLANT
STAPLER VISISTAT 35W (STAPLE) ×2 IMPLANT
SUT MNCRL 0 VIOLET CTX 36 (SUTURE) ×2 IMPLANT
SUT MON AB 2-0 CT1 27 (SUTURE) ×2 IMPLANT
SUT MON AB-0 CT1 36 (SUTURE) ×4 IMPLANT
SUT MONOCRYL 0 CTX 36 (SUTURE) ×2
SUT PLAIN 0 NONE (SUTURE) IMPLANT
SUT PLAIN 2 0 XLH (SUTURE) IMPLANT
SYR CONTROL 10ML LL (SYRINGE) ×2 IMPLANT
TOWEL OR 17X24 6PK STRL BLUE (TOWEL DISPOSABLE) ×4 IMPLANT
TRAY FOLEY CATH 14FR (SET/KITS/TRAYS/PACK) ×2 IMPLANT
WATER STERILE IRR 1000ML POUR (IV SOLUTION) ×2 IMPLANT

## 2011-06-17 NOTE — Op Note (Signed)
Cesarean Section Procedure Note  Indications: previous uterine incision Kerr x 1  Pre-operative Diagnosis: 39 week 5 day pregnancy.  Post-operative Diagnosis: same + Pelvic adhesions  Surgeon: Lenoard Aden   Assistants: Fredric Mare, CNM  Anesthesia: Spinal anesthesia  ASA Class: 2  Procedure Details  The patient was seen in the Holding Room. The risks, benefits, complications, treatment options, and expected outcomes were discussed with the patient.  The patient concurred with the proposed plan, giving informed consent. The risks of anesthesia, infection, bleeding and possible injury to other organs discussed. Injury to bowel, bladder, or ureter with possible need for repair discussed. Possible need for transfusion with secondary risks of hepatitis or HIV acquisition discussed. Post operative complications to include but not limited to DVT, PE and Pneumonia noted. The site of surgery properly noted/marked. The patient was taken to Operating Room # 1, identified as [facility] and the procedure verified as C-Section Delivery. A Time Out was held and the above information confirmed.  After induction of anesthesia, the patient was draped and prepped in the usual sterile manner. A Pfannenstiel incision was made and carried down through the subcutaneous tissue to the fascia. Fascial incision was made and extended transversely using Mayo scissors. The fascia was separated from the underlying rectus tissue superiorly and inferiorly. The peritoneum was identified and entered. Peritoneal incision was extended longitudinally. The utero-vesical peritoneal reflection was incised transversely and the bladder flap was bluntly freed from the lower uterine segment. A low transverse uterine incision(Kerr hysterotomy) was made. Delivered from OT presentation was a  female with Apgar scores of 8 at one minute and 9 at five minutes. After the umbilical cord was clamped and cut cord blood was obtained for  evaluation. The placenta was removed intact and appeared normal. The uterine outline, tubes and ovaries appeared normal. The uterine incision was closed with running locked sutures of 0 Monocryl x 2 layers.  Interrupted monocryl suture x 2 for hemostasis. Omentum adherent to fascia anteriorly. Fascia released with sharp and blunt dissection. Hemostasis was observed. Lavage was carried out until clear. The fascia was then reapproximated with running sutures of 0 Monocryl. The skin was reapproximated with staples.  Instrument, sponge, and needle counts were correct prior the abdominal closure and at the conclusion of the case.   Findings: Omental adhesions  Estimated Blood Loss:  500         Drains: Foley                 Specimens: placenta                 Complications:  None; patient tolerated the procedure well.         Disposition: PACU - hemodynamically stable.         Condition: stable  Attending Attestation: I performed the procedure.

## 2011-06-17 NOTE — Anesthesia Procedure Notes (Signed)
Spinal  Patient location during procedure: OR Start time: 06/17/2011 7:48 AM End time: 06/17/2011 7:54 AM Staffing Anesthesiologist: Sandrea Hughs Performed by: anesthesiologist  Preanesthetic Checklist Completed: patient identified, site marked, surgical consent, pre-op evaluation, timeout performed, IV checked, risks and benefits discussed and monitors and equipment checked Spinal Block Patient position: sitting Prep: DuraPrep Patient monitoring: heart rate, cardiac monitor, continuous pulse ox and blood pressure Approach: midline Location: L3-4 Injection technique: single-shot Needle Needle type: Sprotte  Needle gauge: 24 G Needle length: 9 cm Assessment Sensory level: T4

## 2011-06-17 NOTE — Anesthesia Postprocedure Evaluation (Signed)
  Anesthesia Post-op Note  Patient: [facility]  Procedure(s) Performed:  CESAREAN SECTION - Repeat  Patient Location: PACU and Mother/Baby  Anesthesia Type: Spinal  Level of Consciousness: awake, alert , oriented and patient cooperative  Airway and Oxygen Therapy: Patient Spontanous Breathing  Post-op Pain: mild  Post-op Assessment: Post-op Vital signs reviewed  Post-op Vital Signs: Reviewed and stable  Complications: No apparent anesthesia complications

## 2011-06-17 NOTE — Anesthesia Postprocedure Evaluation (Signed)
Anesthesia Post Note  Patient: L[facility]  Procedure(s) Performed:  CESAREAN SECTION - Repeat  Anesthesia type: Spinal  Patient location: PACU  Post pain: Pain level controlled  Post assessment: Post-op Vital signs reviewed  Last Vitals:  Filed Vitals:   06/17/11 0915  BP: 104/56  Pulse: 65  Temp:   Resp: 19    Post vital signs: Reviewed  Level of consciousness: awake  Complications: No apparent anesthesia complications

## 2011-06-17 NOTE — Transfer of Care (Signed)
Immediate Anesthesia Transfer of Care Note  Patient: [facility]  Procedure(s) Performed:  CESAREAN SECTION - Repeat  Patient Location: PACU  Anesthesia Type: Spinal  Level of Consciousness: alert  and oriented  Airway & Oxygen Therapy: Patient Spontanous Breathing  Post-op Assessment: Report given to PACU RN and Post -op Vital signs reviewed and stable  Post vital signs: stable  Complications: No apparent anesthesia complications

## 2011-06-17 NOTE — Addendum Note (Signed)
Addendum  created 06/17/11 2007 by Rosalia Hammers   Modules edited:Notes Section

## 2011-06-17 NOTE — Progress Notes (Signed)
Patient ID: Lori [facility], female   DOB: [birth year], 35 y.o.   MRN: [id number] Update done. Pt. Examined. Consent done. No changes noted.

## 2011-06-17 NOTE — Anesthesia Preprocedure Evaluation (Signed)
Anesthesia Evaluation  Patient identified by MRN, date of birth, ID band Patient awake    Reviewed: Allergy & Precautions, H&P , Patient's Chart, lab work & pertinent test results  Airway Mallampati: II TM Distance: >3 FB Neck ROM: full    Dental No notable dental hx.    Pulmonary  clear to auscultation  Pulmonary exam normal       Cardiovascular Exercise Tolerance: Good regular Normal    Neuro/Psych  Headaches, Pt has MS, no current deficits or sx    GI/Hepatic GERD-  ,  Endo/Other    Renal/GU      Musculoskeletal   Abdominal   Peds  Hematology   Anesthesia Other Findings   Reproductive/Obstetrics                           Anesthesia Physical Anesthesia Plan  ASA: II  Anesthesia Plan: Spinal and Epidural   Post-op Pain Management:    Induction:   Airway Management Planned:   Additional Equipment:   Intra-op Plan:   Post-operative Plan:   Informed Consent: I have reviewed the patients History and Physical, chart, labs and discussed the procedure including the risks, benefits and alternatives for the proposed anesthesia with the patient or authorized representative who has indicated his/her understanding and acceptance.   Dental Advisory Given  Plan Discussed with: CRNA  Anesthesia Plan Comments: (Lab work confirmed ....... Platelets okay. Discussed both an epidural and  spinal anesthetic, the patient consents to either procedure:  included risk of possible headache,backache, failed block, allergic reaction, and unpredictable course of MS and any possible nerve injury. This patient was asked if she had any questions or concerns before the procedure started. )        Anesthesia Quick Evaluation

## 2011-06-18 LAB — CBC
Hemoglobin: 7.7 g/dL — ABNORMAL LOW (ref 12.0–15.0)
MCH: 29.8 pg (ref 26.0–34.0)
MCHC: 32.9 g/dL (ref 30.0–36.0)
MCV: 90.7 fL (ref 78.0–100.0)

## 2011-06-18 NOTE — Progress Notes (Signed)
Patient ID: Lori Dean, female   DOB: January 09, 1976, 35 y.o.   MRN: [id number]  S:         Reports feeling well             Tolerating po intake / no nausea / no  vomiting / no flatus / no  BM             Bleeding is light             Pain controlled withprescription NSAID's including motrin and percocet             Up ad lib / ambulatory  Newborn breast feeding     O:  A & O x 3 NAD             VS: Blood pressure 81/41, pulse 78, temperature 97.7 F (36.5 C), temperature source Oral, resp. rate 18, last menstrual period 09/14/2010, SpO2 98.00%.  LABS:  Lab Results  Component Value Date   WBC 10.6* 06/18/2011   HGB 7.7* 06/18/2011   HCT 23.4* 06/18/2011   MCV 90.7 06/18/2011   PLT 121* 06/18/2011     I&O: I/O last 3 completed shifts: In: 5245.8 [P.O.:900; I.V.:4345.8] Out: 3200 [Urine:1600; Blood:1600]      Lungs: Clear and unlabored  Heart: regular rate and rhythm / no mumurs  Abdomen: soft, non-tender, non-distended with hypoactive BS             Fundus: firm, non-tender, Ueven             Dressing intact              Perineum: no edema  Lochia: light  Extremities: trace edema, no calf pain or tenderness  A:        POD # 1 S/P cesarean section - repeat              P:        Routine postoperative care                   Lori Dean 06/18/2011, 8:06 AM

## 2011-06-19 MED ORDER — DOCUSATE SODIUM 100 MG PO CAPS
100.0000 mg | ORAL_CAPSULE | Freq: Two times a day (BID) | ORAL | Status: DC
  Administered 2011-06-19 – 2011-06-20 (×2): 100 mg via ORAL
  Filled 2011-06-19 (×2): qty 1

## 2011-06-19 NOTE — Progress Notes (Signed)
POD#2  Pt notes pain well controlled w/ po meds, no BM and concerned about constipation. Tol reg po. Voiding nl, up and walking w/o dizziness. Minimal lochia. Breastfeeding. 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 No depression at this time, just wants to know how to ID early.  Filed Vitals:   06/18/11 0807 06/18/11 1448 06/18/11 2210 06/19/11 0600  BP: 83/41 92/55 89/53  93/60  Pulse: 77 114 101 73  Temp: 97.7 F (36.5 C) 98.6 F (37 C) 98.4 F (36.9 C) 98.2 F (36.8 C)  TempSrc: Oral  Oral Oral  Resp: 20 18 18 18   SpO2: 98%       CV: RRR Pulm: CTAB Gen: well appearing, no distress Abd: soft, approp tender, fundus firm- NT Inc: C/D/I- staples  CBC    Component Value Date/Time   WBC 10.6* 06/18/2011 0506   RBC 2.58* 06/18/2011 0506   HGB 7.7* 06/18/2011 0506   HCT 23.4* 06/18/2011 0506   PLT 121* 06/18/2011 0506   MCV 90.7 06/18/2011 0506   MCH 29.8 06/18/2011 0506   MCHC 32.9 06/18/2011 0506   RDW 13.1 06/18/2011 0506     A/P: 35 yo G2P2 POD# 2 - acute blood loss anemia. Asymptomatic. D/w pt options of tx but will defer as no sx. Will plan iron once begins to have BM - nl post-op healing - constipation. Will add colace, esp as pt to start iron soon.  - PP depression. D/w pt r/b of starting Zoloft, will hold for now. Plan eval in office 2 wks PP for screening. Will give SW consult.  Janijah Symons A. 06/19/2011 11:31 AM

## 2011-06-19 NOTE — Progress Notes (Signed)
PSYCHOSOCIAL ASSESSMENT ~ MATERNAL/CHILD Name:  Latrice Storlie Age 35 Referral Date  06/19/11 Reason/Source  Hx of PPD  I. FAMILY/HOME ENVIRONMENT A. Child's Legal Guardian x Parent(s)  Precious Haws parent    DSS  Name  Emilynn Srinivasan DOB  10[birth year]09-1975 Age  [address]   Address [address] [place] [address]  Name  Stacee Earp DOB Age  Address Same as above Other Household Members/Support Persons Name  Parul Porcelli Relationship Sister DOB        Name                   Relationship                   DOB        Name                   Relationship                   DOB                   Name                   Relationship                   DOB C.   Other Support   II. PSYCHOSOCIAL DATA A. Information Source  x Patient Interview   Family Interview           Other B. Materials engineer  [employer]  No   [employer]  BCBS                                  Self Pay   Food Stamps      WIC  No   Work Scientist, physiological Housing      Section 8     Maternity Care Coordination/Child Service Coordination/Early Intervention    School  Grade      Other Cultural and Environment Information Cultural Issues Impacting Care  III. STRENGTHS  Supportive family/friends  Yes    Adequate Resources  Yes Compliance with medical plan  Yes Home prepared for Child (including basic supplies)  Yes Understanding of illness N/A          Other  IV. RISK FACTORS AND CURRENT PROBLEMS      No Problems Note   Substance Abuse                                             Pt Family             Mental Illness     Pt Family               Family/Relationship Issues   Pt Family      Abuse/Neglect/Domestic Violence   Pt Family   Financial Resources     Pt Family  Transportation     Pt Family  DSS Involvement    Pt Family  Adjustment to Illness    Pt Family   Knowledge/Cognitive Deficit   Pt Family   Compliance with  Treatment  Pt Family   Basic Needs (food, housing, etc)  Pt Family  Housing Concerns    Pt Family  Other             V. SOCIAL WORK ASSESSMENT CSW met with pt, discussed history of PPD, pt expressed this was over three years ago and did have medication management for two months due to PPD.  Pt reports no current concerns with PPD or any other symptoms.  Pt has [employer] and does not have any concerns about insurance coverage.  Pt does not express any concerns about support or supplies when discharging.  Please reconsult CSW if further needs arise.   VI. SOCIAL WORK PLAN (In [place]) No Further Intervention Required/No Barriers to Discharge Psychosocial Support and Ongoing Assessment of Needs Patient/Family  Education Child Protective Services Report  [place]        Date Information/Referral to [facility]   Other

## 2011-06-20 ENCOUNTER — Encounter (HOSPITAL_COMMUNITY): Payer: Self-pay

## 2011-06-20 ENCOUNTER — Encounter (HOSPITAL_COMMUNITY)
Admission: RE | Admit: 2011-06-20 | Discharge: 2011-06-20 | Disposition: A | Discharge status: Home / Self Care | Payer: BC Managed Care – PPO | Source: Ambulatory Visit | Attending: Obstetrics and Gynecology | Admitting: Obstetrics and Gynecology

## 2011-06-20 DIAGNOSIS — O923 Agalactia: Secondary | ICD-10-CM | POA: Insufficient documentation

## 2011-06-20 DIAGNOSIS — D62 Acute posthemorrhagic anemia: Secondary | ICD-10-CM

## 2011-06-20 HISTORY — DX: Acute posthemorrhagic anemia: D62

## 2011-06-20 LAB — CCBB MATERNAL DONOR DRAW

## 2011-06-20 MED ORDER — DOCUSATE SODIUM 100 MG PO CAPS: 100.0000 mg | ORAL_CAPSULE | Freq: Two times a day (BID) | ORAL | Status: AC

## 2011-06-20 MED ORDER — OXYCODONE-ACETAMINOPHEN 5-325 MG PO TABS: 1.0000 | ORAL_TABLET | ORAL | Status: AC | PRN

## 2011-06-20 MED ORDER — POLYSACCHARIDE IRON COMPLEX 150 MG PO CAPS: 150.0000 mg | ORAL_CAPSULE | Freq: Two times a day (BID) | ORAL | Status: AC

## 2011-06-20 MED ORDER — IBUPROFEN 600 MG PO TABS: 600.0000 mg | ORAL_TABLET | Freq: Four times a day (QID) | ORAL | Status: AC

## 2011-06-20 NOTE — Discharge Summary (Signed)
Obstetric Discharge Summary Reason for Admission: cesarean section and scheduled repeat Prenatal Procedures: ultrasound Intrapartum Procedures: cesarean: low cervical, transverse Postpartum Procedures: none Complications-Operative and Postpartum: Acute blood loss anemia  Temp:  [97.6 F (36.4 C)-98.5 F (36.9 C)] 98.5 F (36.9 C) (11/26 0550) Pulse Rate:  [82-96] 82  (11/26 0550) Resp:  [18] 18  (11/26 0550) BP: (94-102)/(57-66) 98/66 mmHg (11/26 0550) Hemoglobin  Date Value Range Status  06/18/2011 7.7* 12.0-15.0 (g/dL) Final     HCT  Date Value Range Status  06/18/2011 23.4* 36.0-46.0 (%) Final    Hospital Course:  uneventful  Discharge Diagnoses: Term Pregnancy-delivered and acute blood loss anemia - stable;  hx PP depression - declines meds, will f/u at 2 wks PP in office  Discharge Information: Date: 06/20/2011 Activity: pelvic rest Diet: routine Medications:  Medication List  As of 06/20/2011  9:38 AM   START taking these medications         ibuprofen 600 MG tablet   Commonly known as: ADVIL,MOTRIN   Take 1 tablet (600 mg total) by mouth every 6 (six) hours.      iron polysaccharides 150 MG capsule   Commonly known as: NIFEREX   Take 1 capsule (150 mg total) by mouth 2 (two) times daily.      oxyCODONE-acetaminophen 5-325 MG per tablet   Commonly known as: PERCOCET   Take 1-2 tablets by mouth every 3 (three) hours as needed (moderate - severe pain).         CHANGE how you take these medications         docusate sodium 100 MG capsule   Commonly known as: COLACE   Take 1 capsule (100 mg total) by mouth 2 (two) times daily.   What changed: how often to take the med         CONTINUE taking these medications         DHA COMPLETE PO      prenatal vitamin w/FE, FA 27-1 MG Tabs      ranitidine 75 MG tablet   Commonly known as: ZANTAC         STOP taking these medications         calcium carbonate 500 MG chewable tablet      ferrous sulfate  dried 160 (50 FE) MG Tbcr          Where to get your medications    These are the prescriptions that you need to pick up.   You may get these medications from any pharmacy.         docusate sodium 100 MG capsule   ibuprofen 600 MG tablet   iron polysaccharides 150 MG capsule   oxyCODONE-acetaminophen 5-325 MG per tablet           Condition: stable Instructions: refer to practice specific booklet Discharge to: home Follow-up Information    Follow up with Lenoard Aden, MD in 2 weeks. (post-partum depression follow-up)    Contact information:   [address] [place] [address] [phone]       Follow up with [facility]. Make an appointment in 3 days. (for staple removal)          Newborn Data: Live born  Information for the patient's newborn:  Lupie, Sawa [[id number]]  female ;9/9 APGAR , ;8-3 weight ;  Home with mother.  Normagene Harvie 06/20/2011, 9:38 AM

## 2011-06-20 NOTE — Progress Notes (Signed)
Subjective: POD# 3 Information for the patient's newborn:  Lori Dean, Lori Dean [[id number]]  female   Reports feeling well.  Feeding: breast, difficulty latching,. Patient reports tolerating PO.  Breast symptoms: sore nipples Pain controlled withprescription NSAID's including motrin and narcotic analgesics including percocet. Denies HA/SOB/C/P/N/V/dizziness. Flatus present. She reports vaginal bleeding as normal, without clots.  She is ambulating, urinating without difficult.     Objective:   VS:  Filed Vitals:   06/19/11 0600 06/19/11 1414 06/19/11 2155 06/20/11 0550  BP: 93/60 94/57 102/63 98/66  Pulse: 73 84 96 82  Temp: 98.2 F (36.8 C) 97.6 F (36.4 C) 98.2 F (36.8 C) 98.5 F (36.9 C)  TempSrc: Oral Oral Oral Oral  Resp: 18 18 18 18   SpO2:        No intake or output data in the 24 hours ending 06/20/11 0908      Basename 06/18/11 0506  WBC 10.6*  HGB 7.7*  HCT 23.4*  PLT 121*     Blood type: --/--/O POS (11/24 0500)  Rubella: Immune (04/12 0000)     Physical Exam:  General: alert, cooperative and no distress CV: Regular rate and rhythm Resp: clear Breast: flat nipples, (+) nipple trauma bilat. Abdomen: soft, nontender, normal bowel sounds Incision: clean, intact, serous and clear drainage present and staples intact Uterine Fundus: firm, below umbilicus, nontender Lochia: minimal Ext: Homans sign is negative, no sign of DVT and no edema, redness or tenderness in the calves or thighs      Assessment/Plan: 35 y.o.  status post Cesarean section. POD# 3.  s/p Cesarean Delivery.  Indications: repeat elective                Active Problems:  Postpartum care following cesarean delivery (11/23)  -stable post-op Acute blood loss anemia - start oral Fe and colace  Breastfeeding - difficulty latching - nipple shields until healed, lactation consult for improved latching.  Community referral for [employer] support  Hx PP depression - increased risk for PPD w/  ABL anemia and difficult BFing,  f/u 2 wks PP visit  Routine post-op care Staple removal at [facility] in 3 days  D/C home  PAUL,DANIELA 06/20/2011, 9:08 AM

## 2011-06-23 ENCOUNTER — Other Ambulatory Visit: Payer: Self-pay | Admitting: Obstetrics and Gynecology

## 2011-06-23 DIAGNOSIS — N6320 Unspecified lump in the left breast, unspecified quadrant: Secondary | ICD-10-CM

## 2011-06-29 ENCOUNTER — Ambulatory Visit
Admission: RE | Admit: 2011-06-29 | Discharge: 2011-06-29 | Disposition: A | Discharge status: Home / Self Care | Payer: BC Managed Care – PPO | Source: Ambulatory Visit | Attending: Obstetrics and Gynecology | Admitting: Obstetrics and Gynecology

## 2011-06-29 DIAGNOSIS — N6320 Unspecified lump in the left breast, unspecified quadrant: Secondary | ICD-10-CM

## 2011-07-21 ENCOUNTER — Encounter (HOSPITAL_COMMUNITY)
Admission: RE | Admit: 2011-07-21 | Discharge: 2011-07-21 | Disposition: A | Discharge status: Home / Self Care | Payer: BC Managed Care – PPO | Source: Ambulatory Visit | Attending: Obstetrics and Gynecology | Admitting: Obstetrics and Gynecology

## 2011-07-21 DIAGNOSIS — O923 Agalactia: Secondary | ICD-10-CM | POA: Insufficient documentation

## 2013-04-05 ENCOUNTER — Ambulatory Visit: Payer: Self-pay | Admitting: Family Medicine

## 2013-08-21 ENCOUNTER — Ambulatory Visit: Payer: Self-pay | Admitting: Physician Assistant

## 2013-08-21 LAB — RAPID STREP-A WITH REFLX: MICRO TEXT REPORT: POSITIVE

## 2013-08-21 LAB — RAPID INFLUENZA A&B ANTIGENS (ARMC ONLY)

## 2014-05-26 ENCOUNTER — Encounter (HOSPITAL_COMMUNITY): Payer: Self-pay

## 2016-05-02 DIAGNOSIS — H5213 Myopia, bilateral: Secondary | ICD-10-CM | POA: Diagnosis not present

## 2016-06-13 DIAGNOSIS — Z6837 Body mass index (BMI) 37.0-37.9, adult: Secondary | ICD-10-CM | POA: Diagnosis not present

## 2016-06-13 DIAGNOSIS — Z01419 Encounter for gynecological examination (general) (routine) without abnormal findings: Secondary | ICD-10-CM | POA: Diagnosis not present

## 2016-08-09 DIAGNOSIS — G35 Multiple sclerosis: Secondary | ICD-10-CM | POA: Diagnosis not present

## 2016-08-09 DIAGNOSIS — R41840 Attention and concentration deficit: Secondary | ICD-10-CM | POA: Diagnosis not present

## 2016-08-09 DIAGNOSIS — R5383 Other fatigue: Secondary | ICD-10-CM | POA: Diagnosis not present

## 2017-02-01 DIAGNOSIS — G35 Multiple sclerosis: Secondary | ICD-10-CM | POA: Diagnosis not present

## 2017-02-01 DIAGNOSIS — M50021 Cervical disc disorder at C4-C5 level with myelopathy: Secondary | ICD-10-CM | POA: Diagnosis not present

## 2017-03-23 DIAGNOSIS — R5383 Other fatigue: Secondary | ICD-10-CM | POA: Diagnosis not present

## 2017-03-23 DIAGNOSIS — R41840 Attention and concentration deficit: Secondary | ICD-10-CM | POA: Diagnosis not present

## 2017-03-23 DIAGNOSIS — G35 Multiple sclerosis: Secondary | ICD-10-CM | POA: Diagnosis not present

## 2017-06-06 DIAGNOSIS — H01139 Eczematous dermatitis of unspecified eye, unspecified eyelid: Secondary | ICD-10-CM | POA: Diagnosis not present

## 2017-08-11 DIAGNOSIS — Z6832 Body mass index (BMI) 32.0-32.9, adult: Secondary | ICD-10-CM | POA: Diagnosis not present

## 2017-08-11 DIAGNOSIS — Z1231 Encounter for screening mammogram for malignant neoplasm of breast: Secondary | ICD-10-CM | POA: Diagnosis not present

## 2017-08-11 DIAGNOSIS — Z01419 Encounter for gynecological examination (general) (routine) without abnormal findings: Secondary | ICD-10-CM | POA: Diagnosis not present

## 2017-09-07 DIAGNOSIS — Z13220 Encounter for screening for lipoid disorders: Secondary | ICD-10-CM | POA: Diagnosis not present

## 2017-09-07 DIAGNOSIS — Z1329 Encounter for screening for other suspected endocrine disorder: Secondary | ICD-10-CM | POA: Diagnosis not present

## 2017-09-07 DIAGNOSIS — Z1321 Encounter for screening for nutritional disorder: Secondary | ICD-10-CM | POA: Diagnosis not present

## 2017-09-13 DIAGNOSIS — R5383 Other fatigue: Secondary | ICD-10-CM | POA: Diagnosis not present

## 2017-09-13 DIAGNOSIS — Z5181 Encounter for therapeutic drug level monitoring: Secondary | ICD-10-CM | POA: Diagnosis not present

## 2017-09-13 DIAGNOSIS — R41840 Attention and concentration deficit: Secondary | ICD-10-CM | POA: Diagnosis not present

## 2017-12-18 DIAGNOSIS — H5213 Myopia, bilateral: Secondary | ICD-10-CM | POA: Diagnosis not present

## 2018-03-15 DIAGNOSIS — Z5181 Encounter for therapeutic drug level monitoring: Secondary | ICD-10-CM | POA: Diagnosis not present

## 2018-03-15 DIAGNOSIS — R5383 Other fatigue: Secondary | ICD-10-CM | POA: Diagnosis not present

## 2018-03-15 DIAGNOSIS — G35 Multiple sclerosis: Secondary | ICD-10-CM | POA: Diagnosis not present

## 2018-03-15 DIAGNOSIS — R41840 Attention and concentration deficit: Secondary | ICD-10-CM | POA: Diagnosis not present

## 2018-03-15 DIAGNOSIS — E559 Vitamin D deficiency, unspecified: Secondary | ICD-10-CM | POA: Diagnosis not present

## 2018-03-23 DIAGNOSIS — G35 Multiple sclerosis: Secondary | ICD-10-CM | POA: Diagnosis not present

## 2018-03-23 DIAGNOSIS — G378 Other specified demyelinating diseases of central nervous system: Secondary | ICD-10-CM | POA: Diagnosis not present

## 2018-03-23 DIAGNOSIS — M5021 Other cervical disc displacement,  high cervical region: Secondary | ICD-10-CM | POA: Diagnosis not present

## 2018-04-02 DIAGNOSIS — G35 Multiple sclerosis: Secondary | ICD-10-CM | POA: Diagnosis not present

## 2018-04-29 ENCOUNTER — Ambulatory Visit
Admission: EM | Admit: 2018-04-29 | Discharge: 2018-04-29 | Disposition: A | Discharge status: Home / Self Care | Payer: BLUE CROSS/BLUE SHIELD | Attending: Family Medicine | Admitting: Family Medicine

## 2018-04-29 DIAGNOSIS — J40 Bronchitis, not specified as acute or chronic: Secondary | ICD-10-CM

## 2018-04-29 DIAGNOSIS — R05 Cough: Secondary | ICD-10-CM | POA: Diagnosis not present

## 2018-04-29 DIAGNOSIS — R059 Cough, unspecified: Secondary | ICD-10-CM

## 2018-04-29 MED ORDER — AZITHROMYCIN 250 MG PO TABS: ORAL_TABLET | ORAL | 0 refills | Status: AC

## 2018-04-29 NOTE — ED Provider Notes (Signed)
[facility]    CSN: [id number] Arrival date & time: 04/29/18  0850     History   Chief Complaint Chief Complaint  Patient presents with  . Cough    HPI Lori Dean is a 42 y.o. female.   The history is provided by the patient.  Cough  Associated symptoms: no wheezing   URI  Presenting symptoms: congestion, cough and fatigue   Severity:  Moderate Onset quality:  Sudden Duration:  3 weeks Timing:  Constant Progression:  Worsening Chronicity:  New Relieved by:  Nothing Ineffective treatments:  OTC medications Associated symptoms: no sinus pain and no wheezing   Risk factors: sick contacts   Risk factors: not elderly, no chronic cardiac disease, no chronic kidney disease, no chronic respiratory disease, no diabetes mellitus, no immunosuppression, no recent illness and no recent travel     Past Medical History:  Diagnosis Date  . Acute blood loss anemia 06/20/2011  . Anemia   . Depression    post partum depression history  . GERD (gastroesophageal reflux disease)    with pregnancy, use tums and zantac  . Headache(784.0)   . MVP (mitral valve prolapse)   . Relapsing remitting multiple sclerosis (HCC)   . Scoliosis     Patient Active Problem List   Diagnosis Date Noted  . Acute blood loss anemia 06/20/2011  . Postpartum care following cesarean delivery (11/23) 06/17/2011    Past Surgical History:  Procedure Laterality Date  . CESAREAN SECTION  2009  . CESAREAN SECTION  06/17/2011   Procedure: CESAREAN SECTION;  Surgeon: Lenoard Aden, MD;  Location: [facility];  Service: Gynecology;  Laterality: N/A;  Repeat  . TONSILLECTOMY  age 36  . WISDOM TOOTH EXTRACTION      OB History    Gravida  2   Para  2   Term  2   Preterm      AB      Living  2     SAB      TAB      Ectopic      Multiple      Live Births  1            Home Medications    Prior to Admission medications   Medication Sig Start Date End Date Taking?  Authorizing Provider  amphetamine-dextroamphetamine (ADDERALL) 5 MG tablet  04/23/18  Yes [provider]  Norgestimate-Ethinyl Estradiol Triphasic 0.18/0.215/0.25 MG-35 MCG tablet  04/19/18  Yes [provider]  sertraline (ZOLOFT) 50 MG tablet Take by mouth. 03/07/18  Yes [provider]  azithromycin (ZITHROMAX Z-PAK) 250 MG tablet 2 tabs po once day 1, then 1 tab po qd for next 4 days 04/29/18   Payton Mccallum, MD  Docosahexaenoic Acid (DHA COMPLETE PO) Take 1 tablet by mouth at bedtime.      [provider]  docusate sodium (COLACE) 100 MG capsule Take 1 capsule (100 mg total) by mouth 2 (two) times daily. 06/20/11   Neta Mends, CNM  iron polysaccharides (NU-IRON) 150 MG capsule Take 1 capsule (150 mg total) by mouth 2 (two) times daily. 06/20/11 06/19/12  Neta Mends, CNM  prenatal vitamin w/FE, FA (PRENATAL 1 + 1) 27-1 MG TABS Take 1 tablet by mouth at bedtime.      [provider]  ranitidine (ZANTAC) 75 MG tablet Take 75 mg by mouth daily as needed. Heartburn      [provider]  Family History No family history on file.  Social History Social History   Tobacco Use  . Smoking status: Never Smoker  . Smokeless tobacco: Never Used  Substance Use Topics  . Alcohol use: No  . Drug use: No     Allergies   Loperamide and Neosporin [neomycin-polymyxin-gramicidin]   Review of Systems Review of Systems  Constitutional: Positive for fatigue.  HENT: Positive for congestion. Negative for sinus pain.   Respiratory: Positive for cough. Negative for wheezing.      Physical Exam Triage Vital Signs ED Triage Vitals  Enc Vitals Group     BP 04/29/18 0900 123/73     Pulse Rate 04/29/18 0900 86     Resp 04/29/18 0900 18     Temp 04/29/18 0900 98.6 F (37 C)     Temp Source 04/29/18 0900 Oral     SpO2 04/29/18 0900 99 %     Weight 04/29/18 0902 180 lb (81.6 kg)     Height --      Head Circumference --      Peak  Flow --      Pain Score 04/29/18 0902 0     Pain Loc --      Pain Edu? --      Excl. in GC? --    No data found.  Updated Vital Signs BP 123/73 (BP Location: Right Arm)   Pulse 86   Temp 98.6 F (37 C) (Oral)   Resp 18   Wt 81.6 kg   LMP 04/08/2018 (Exact Date)   SpO2 99%   Breastfeeding? No   BMI 30.90 kg/m   Visual Acuity Right Eye Distance:   Left Eye Distance:   Bilateral Distance:    Right Eye Near:   Left Eye Near:    Bilateral Near:     Physical Exam  Constitutional: She appears well-developed and well-nourished. No distress.  HENT:  Head: Normocephalic and atraumatic.  Right Ear: Tympanic membrane, external ear and ear canal normal.  Left Ear: Tympanic membrane, external ear and ear canal normal.  Nose: Mucosal edema and rhinorrhea present. No nose lacerations, sinus tenderness, nasal deformity, septal deviation or nasal septal hematoma. No epistaxis.  No foreign bodies. Right sinus exhibits no maxillary sinus tenderness and no frontal sinus tenderness. Left sinus exhibits no maxillary sinus tenderness and no frontal sinus tenderness.  Mouth/Throat: Uvula is midline, oropharynx is clear and moist and mucous membranes are normal. No oropharyngeal exudate. No tonsillar exudate.  Eyes: Conjunctivae are normal. Right eye exhibits no discharge. Left eye exhibits no discharge. No scleral icterus.  Neck: Normal range of motion. Neck supple. No thyromegaly present.  Cardiovascular: Normal rate, regular rhythm and normal heart sounds.  Pulmonary/Chest: Effort normal. No stridor. No respiratory distress. She has no wheezes. She has no rales.  Diffuse rhonchi  Lymphadenopathy:    She has no cervical adenopathy.  Skin: She is not diaphoretic.  Nursing note and vitals reviewed.    UC Treatments / Results  Labs (all labs ordered are listed, but only abnormal results are displayed) Labs Reviewed - No data to display  EKG None  Radiology No results  found.  Procedures Procedures (including critical care time)  Medications Ordered in UC Medications - No data to display  Initial Impression / Assessment and Plan / UC Course  I have reviewed the triage vital signs and the nursing notes.  Pertinent labs & imaging results that were available during my care of the patient were reviewed  by me and considered in my medical decision making (see chart for details).      Final Clinical Impressions(s) / UC Diagnoses   Final diagnoses:  Cough  Bronchitis    ED Prescriptions    Medication Sig Dispense Auth. Provider   azithromycin (ZITHROMAX Z-PAK) 250 MG tablet 2 tabs po once day 1, then 1 tab po qd for next 4 days 6 each Payton Mccallum, MD     1. diagnosis reviewed with patient 2. rx as per orders above; reviewed possible side effects, interactions, risks and benefits  3. Recommend supportive treatment with otc meds 4. Follow-up prn if symptoms worsen or don't improve  Controlled Substance Prescriptions [place] Controlled Substance Registry consulted? Not Applicable   Payton Mccallum, MD 04/29/18 1018

## 2018-04-29 NOTE — ED Triage Notes (Signed)
Pt here for cough for 3 weeks, has been productive and has been swallowing the mucus. States she is now having chest tightness. Has been taking dayquil with no relief.

## 2018-06-07 DIAGNOSIS — H01139 Eczematous dermatitis of unspecified eye, unspecified eyelid: Secondary | ICD-10-CM | POA: Diagnosis not present

## 2018-09-19 DIAGNOSIS — E559 Vitamin D deficiency, unspecified: Secondary | ICD-10-CM | POA: Diagnosis not present

## 2018-09-19 DIAGNOSIS — R41840 Attention and concentration deficit: Secondary | ICD-10-CM | POA: Diagnosis not present

## 2018-09-19 DIAGNOSIS — G35 Multiple sclerosis: Secondary | ICD-10-CM | POA: Diagnosis not present

## 2018-09-19 DIAGNOSIS — R5383 Other fatigue: Secondary | ICD-10-CM | POA: Diagnosis not present

## 2019-01-22 DIAGNOSIS — Z124 Encounter for screening for malignant neoplasm of cervix: Secondary | ICD-10-CM | POA: Diagnosis not present

## 2019-01-22 DIAGNOSIS — Z01419 Encounter for gynecological examination (general) (routine) without abnormal findings: Secondary | ICD-10-CM | POA: Diagnosis not present

## 2019-01-22 DIAGNOSIS — Z6835 Body mass index (BMI) 35.0-35.9, adult: Secondary | ICD-10-CM | POA: Diagnosis not present

## 2019-01-22 DIAGNOSIS — Z1231 Encounter for screening mammogram for malignant neoplasm of breast: Secondary | ICD-10-CM | POA: Diagnosis not present

## 2019-04-24 DIAGNOSIS — H5213 Myopia, bilateral: Secondary | ICD-10-CM | POA: Diagnosis not present

## 2019-05-08 DIAGNOSIS — G35 Multiple sclerosis: Secondary | ICD-10-CM | POA: Diagnosis not present

## 2019-06-06 DIAGNOSIS — H01139 Eczematous dermatitis of unspecified eye, unspecified eyelid: Secondary | ICD-10-CM | POA: Diagnosis not present

## 2019-11-04 DIAGNOSIS — E559 Vitamin D deficiency, unspecified: Secondary | ICD-10-CM | POA: Diagnosis not present

## 2019-11-04 DIAGNOSIS — G35 Multiple sclerosis: Secondary | ICD-10-CM | POA: Diagnosis not present

## 2019-11-04 DIAGNOSIS — R5383 Other fatigue: Secondary | ICD-10-CM | POA: Diagnosis not present

## 2019-11-04 DIAGNOSIS — R41840 Attention and concentration deficit: Secondary | ICD-10-CM | POA: Diagnosis not present

## 2020-02-25 DIAGNOSIS — R05 Cough: Secondary | ICD-10-CM | POA: Diagnosis not present

## 2020-02-25 DIAGNOSIS — R0602 Shortness of breath: Secondary | ICD-10-CM | POA: Diagnosis not present

## 2020-02-25 DIAGNOSIS — R0782 Intercostal pain: Secondary | ICD-10-CM | POA: Diagnosis not present

## 2020-02-27 DIAGNOSIS — J40 Bronchitis, not specified as acute or chronic: Secondary | ICD-10-CM | POA: Diagnosis not present

## 2020-02-27 DIAGNOSIS — R0602 Shortness of breath: Secondary | ICD-10-CM | POA: Diagnosis not present

## 2020-02-27 DIAGNOSIS — G35 Multiple sclerosis: Secondary | ICD-10-CM | POA: Diagnosis not present

## 2020-02-27 DIAGNOSIS — R0600 Dyspnea, unspecified: Secondary | ICD-10-CM | POA: Diagnosis not present

## 2020-02-27 DIAGNOSIS — R0781 Pleurodynia: Secondary | ICD-10-CM | POA: Diagnosis not present

## 2020-02-27 DIAGNOSIS — R921 Mammographic calcification found on diagnostic imaging of breast: Secondary | ICD-10-CM | POA: Diagnosis not present

## 2020-02-27 DIAGNOSIS — R05 Cough: Secondary | ICD-10-CM | POA: Diagnosis not present

## 2020-02-27 DIAGNOSIS — R0789 Other chest pain: Secondary | ICD-10-CM | POA: Diagnosis not present

## 2020-02-27 DIAGNOSIS — J209 Acute bronchitis, unspecified: Secondary | ICD-10-CM | POA: Diagnosis not present

## 2020-02-27 DIAGNOSIS — R000 Tachycardia, unspecified: Secondary | ICD-10-CM | POA: Diagnosis not present

## 2020-03-12 DIAGNOSIS — H109 Unspecified conjunctivitis: Secondary | ICD-10-CM | POA: Diagnosis not present

## 2020-03-16 DIAGNOSIS — H20012 Primary iridocyclitis, left eye: Secondary | ICD-10-CM | POA: Diagnosis not present

## 2020-03-20 DIAGNOSIS — H2000 Unspecified acute and subacute iridocyclitis: Secondary | ICD-10-CM | POA: Diagnosis not present

## 2020-03-25 DIAGNOSIS — H2000 Unspecified acute and subacute iridocyclitis: Secondary | ICD-10-CM | POA: Diagnosis not present

## 2020-04-01 DIAGNOSIS — H5200 Hypermetropia, unspecified eye: Secondary | ICD-10-CM | POA: Diagnosis not present

## 2020-04-17 DIAGNOSIS — H2000 Unspecified acute and subacute iridocyclitis: Secondary | ICD-10-CM | POA: Diagnosis not present

## 2020-05-02 DIAGNOSIS — G35 Multiple sclerosis: Secondary | ICD-10-CM | POA: Diagnosis not present

## 2020-05-02 DIAGNOSIS — G378 Other specified demyelinating diseases of central nervous system: Secondary | ICD-10-CM | POA: Diagnosis not present

## 2020-05-02 DIAGNOSIS — M5021 Other cervical disc displacement,  high cervical region: Secondary | ICD-10-CM | POA: Diagnosis not present

## 2020-05-02 DIAGNOSIS — M4802 Spinal stenosis, cervical region: Secondary | ICD-10-CM | POA: Diagnosis not present

## 2020-05-08 DIAGNOSIS — G35 Multiple sclerosis: Secondary | ICD-10-CM | POA: Diagnosis not present

## 2020-05-08 DIAGNOSIS — H209 Unspecified iridocyclitis: Secondary | ICD-10-CM | POA: Diagnosis not present

## 2020-06-25 DIAGNOSIS — H2000 Unspecified acute and subacute iridocyclitis: Secondary | ICD-10-CM | POA: Diagnosis not present

## 2020-07-02 DIAGNOSIS — G35 Multiple sclerosis: Secondary | ICD-10-CM | POA: Diagnosis not present

## 2020-07-10 DIAGNOSIS — H2000 Unspecified acute and subacute iridocyclitis: Secondary | ICD-10-CM | POA: Diagnosis not present

## 2022-12-29 ENCOUNTER — Emergency Department
Admission: EM | Admit: 2022-12-29 | Discharge: 2022-12-29 | Disposition: A | Discharge status: Home / Self Care | Payer: BC Managed Care – PPO | Attending: Emergency Medicine | Admitting: Emergency Medicine

## 2022-12-29 ENCOUNTER — Emergency Department: Payer: BC Managed Care – PPO

## 2022-12-29 ENCOUNTER — Encounter: Payer: Self-pay | Admitting: Emergency Medicine

## 2022-12-29 DIAGNOSIS — H5452A1 Low vision left eye category 1, normal vision right eye: Secondary | ICD-10-CM | POA: Insufficient documentation

## 2022-12-29 DIAGNOSIS — H5712 Ocular pain, left eye: Secondary | ICD-10-CM | POA: Diagnosis not present

## 2022-12-29 DIAGNOSIS — H5450 Low vision, one eye, unspecified eye: Secondary | ICD-10-CM

## 2022-12-29 HISTORY — DX: Unspecified iridocyclitis: H20.9

## 2022-12-29 MED ORDER — GADOBUTROL 1 MMOL/ML IV SOLN
8.0000 mL | Freq: Once | INTRAVENOUS | Status: AC | PRN
  Administered 2022-12-29: 8 mL via INTRAVENOUS

## 2022-12-29 NOTE — ED Notes (Signed)
Pt returned from MRI back to lobby

## 2022-12-29 NOTE — ED Provider Notes (Signed)
[facility] Provider Note    Event Date/Time   First MD Initiated Contact with Patient 12/29/22 0204     (approximate)   History   Eye Problem   HPI  Lori Dean is a 47 y.o. 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  She reports this is consistent with glaucoma she has had in the past and high pressures.  Reports it does not quite feel like an MS flare or optic neuritis, which she has had in the past.  Reports that the symptoms are just been present since last night and she is already called her ophthalmologist and has an appointment later this morning at 8:30 AM, but due to the associated vision loss she presents to the ED for evaluation to ensure her intraocular pressures are not too high.  She reports that she has dealt with multiple occasions and is just concerned about the high pressures.  I meet the patient triage and using a Tono-Pen with consistent pressures in the low/mid teens, averaging  about 13 mmHg.  No weakness to the extremities, recent illnesses, headache, syncope   Physical Exam   Triage Vital Signs: ED Triage Vitals [12/29/22 0130]  Enc Vitals Group     BP 128/78     Pulse Rate 95     Resp 18     Temp 98.6 F (37 C)     Temp src      SpO2 100 %     Weight      Height      Head Circumference      Peak Flow      Pain Score      Pain Loc      Pain Edu?      Excl. in GC?     Most recent vital signs: Vitals:   12/29/22 0130  BP: 128/78  Pulse: 95  Resp: 18  Temp: 98.6 F (37 C)  SpO2: 100%    General: Awake, no distress.  CV:  Good peripheral perfusion.  Resp:  Normal effort.  Abd:  No distention.  MSK:  No deformity noted.  Neuro:  No focal deficits appreciated. Other:  Mild injected conjunctiva of the left eye.  Pupils are PERRL.  Decreased acuity  throughout all visual fields of the left eye   ED Results / Procedures / Treatments   Labs (all labs ordered are listed, but only abnormal results are displayed) Labs Reviewed - No data to display  EKG   RADIOLOGY   Official radiology report(s): MR Brain W and Wo Contrast  Result Date: 12/29/2022 CLINICAL DATA:  Evaluate multiple sclerosis flare. Left eye vision loss EXAM: MRI HEAD WITHOUT AND WITH CONTRAST TECHNIQUE: Multiplanar, multiecho pulse sequences of the brain and surrounding structures were obtained without and with intravenous contrast. CONTRAST:  8mL GADAVIST GADOBUTROL 1 MMOL/ML IV SOLN COMPARISON:  10/06/2005 FINDINGS: Brain: Small FLAIR hyperintensities in the cerebral white matter, mainly periventricular with ovoid radiating pattern, limited in extent. Few more lesions are seen and in 2007, although different FLAIR technique. No enhancing disease, atrophy, infarct, hydrocephalus, or collection. No abnormal enhancement Vascular: Normal flow voids. Skull and upper cervical spine: Normal marrow signal. Sinuses/Orbits: No gross optic neuritis or other orbital inflammation. IMPRESSION: Mild white matter disease consistent with history of multiple sclerosis. No enhancing  disease. Electronically Signed   By: Tiburcio Pea M.D.   On: 12/29/2022 05:21    PROCEDURES and INTERVENTIONS:  Procedures  Medications  gadobutrol (GADAVIST) 1 MMOL/ML injection 8 mL (8 mLs Intravenous Contrast Given 12/29/22 0428)     IMPRESSION / MDM / ASSESSMENT AND PLAN / ED COURSE  I reviewed the triage vital signs and the nursing notes.  Differential diagnosis includes, but is not limited to, anterior uveitis, posterior uveitis, glaucoma, stroke, optic neuritis  {Patient presents with symptoms of an acute illness or injury that is potentially life-threatening.  Patient presents with left eye redness, mild pain and decreased visual acuity concerning for anterior uveitis.  Her pressures are okay and  I doubt acute glaucoma.  MRI with and without contrast without signs of stroke or optic neuritis.  Doubt MS flare.  After the MRI results I evaluated the patient and she reports persistent symptoms.  I offered to call ophthalmology to evaluate the patient here in the ED, but patient declines indicating that she already has an appointment later this morning at 830 and is comfortable just doing this in the office.  I clarified and ensured that she understood that these few hours of time poses a small risk of permanent vision loss and she expresses understanding and agreement.  We discussed close return precautions and I urged her to not miss this upcoming appointment  Clinical Course as of 12/29/22 0754  Thu Dec 29, 2022  0204 Left eye pressures of 13. I recommend MRI to assess for MS flair, she's agreeable [DS]    Clinical Course User Index [DS] Delton Prairie, MD     FINAL CLINICAL IMPRESSION(S) / ED DIAGNOSES   Final diagnoses:  Low vision of left eye with normal vision in contralateral eye  Left eye pain     Rx / DC Orders   ED Discharge Orders     None        Note:  This document was prepared using Dragon voice recognition software and may include unintentional dictation errors.   Delton Prairie, MD 12/29/22 [phone]

## 2022-12-29 NOTE — ED Triage Notes (Signed)
Pt with hx/o Uveitis of left eye as well as eye pressure complications in same eye. Pt woke this AM with significant visual change in left eye. EDP called to come assess due to concern for eye pressure. Tono-pen retrieved. Pt also reports she has MS.
